# Patient Record
Sex: Female | Born: 1970 | Race: Black or African American | Hispanic: No | Smoking: Current every day smoker
Health system: Southern US, Community
[De-identification: ages and names within clinical notes are randomized; demographics above are authoritative.]

## PROBLEM LIST (undated history)

## (undated) DIAGNOSIS — D649 Anemia, unspecified: Secondary | ICD-10-CM

## (undated) DIAGNOSIS — R7303 Prediabetes: Secondary | ICD-10-CM

## (undated) HISTORY — DX: Prediabetes: R73.03

## (undated) HISTORY — DX: Anemia, unspecified: D64.9

## (undated) HISTORY — PX: TUBAL LIGATION: SHX77

---

## 1998-06-30 ENCOUNTER — Emergency Department (HOSPITAL_COMMUNITY): Admission: EM | Admit: 1998-06-30 | Discharge: 1998-06-30 | Payer: Self-pay | Admitting: Emergency Medicine

## 1999-06-03 ENCOUNTER — Inpatient Hospital Stay (HOSPITAL_COMMUNITY): Admission: AD | Admit: 1999-06-03 | Discharge: 1999-06-07 | Payer: Self-pay | Admitting: Obstetrics and Gynecology

## 1999-10-12 ENCOUNTER — Emergency Department (HOSPITAL_COMMUNITY): Admission: EM | Admit: 1999-10-12 | Discharge: 1999-10-12 | Payer: Self-pay | Admitting: Emergency Medicine

## 2000-01-13 ENCOUNTER — Other Ambulatory Visit: Admission: RE | Admit: 2000-01-13 | Discharge: 2000-01-13 | Payer: Self-pay | Admitting: Obstetrics and Gynecology

## 2000-11-11 ENCOUNTER — Ambulatory Visit (HOSPITAL_COMMUNITY): Admission: RE | Admit: 2000-11-11 | Discharge: 2000-11-11 | Payer: Self-pay | Admitting: Obstetrics and Gynecology

## 2002-03-13 ENCOUNTER — Encounter: Payer: Self-pay | Admitting: *Deleted

## 2002-03-13 ENCOUNTER — Emergency Department (HOSPITAL_COMMUNITY): Admission: EM | Admit: 2002-03-13 | Discharge: 2002-03-13 | Payer: Self-pay | Admitting: Emergency Medicine

## 2002-04-23 ENCOUNTER — Encounter: Admission: RE | Admit: 2002-04-23 | Discharge: 2002-04-23 | Payer: Self-pay | Admitting: Family Medicine

## 2002-04-23 ENCOUNTER — Encounter: Payer: Self-pay | Admitting: Family Medicine

## 2002-10-25 ENCOUNTER — Encounter: Admission: RE | Admit: 2002-10-25 | Discharge: 2002-11-05 | Payer: Self-pay | Admitting: *Deleted

## 2002-11-21 ENCOUNTER — Emergency Department (HOSPITAL_COMMUNITY): Admission: EM | Admit: 2002-11-21 | Discharge: 2002-11-21 | Payer: Self-pay | Admitting: Emergency Medicine

## 2002-11-21 ENCOUNTER — Encounter: Payer: Self-pay | Admitting: Emergency Medicine

## 2003-03-05 ENCOUNTER — Encounter: Payer: Self-pay | Admitting: Family Medicine

## 2003-03-05 ENCOUNTER — Encounter: Admission: RE | Admit: 2003-03-05 | Discharge: 2003-03-05 | Payer: Self-pay | Admitting: Family Medicine

## 2004-06-26 ENCOUNTER — Ambulatory Visit (HOSPITAL_COMMUNITY): Admission: RE | Admit: 2004-06-26 | Discharge: 2004-06-26 | Payer: Self-pay | Admitting: Family Medicine

## 2005-01-09 ENCOUNTER — Emergency Department: Payer: Self-pay | Admitting: Emergency Medicine

## 2007-08-09 ENCOUNTER — Emergency Department (HOSPITAL_COMMUNITY): Admission: EM | Admit: 2007-08-09 | Discharge: 2007-08-10 | Payer: Self-pay | Admitting: Emergency Medicine

## 2009-07-05 ENCOUNTER — Emergency Department (HOSPITAL_COMMUNITY): Admission: EM | Admit: 2009-07-05 | Discharge: 2009-07-06 | Payer: Self-pay | Admitting: Emergency Medicine

## 2009-10-01 ENCOUNTER — Other Ambulatory Visit: Admission: RE | Admit: 2009-10-01 | Discharge: 2009-10-01 | Payer: Self-pay | Admitting: Family Medicine

## 2011-05-14 NOTE — Op Note (Signed)
[facility]  Patient:    Tonya [name], Tonya [name]                        MRN: [id number] Proc. Date: 11/11/00 Attending:  Marcelle Overlie, M.D.                           Operative Report  PREOPERATIVE DIAGNOSIS:       Desires permanent sterilization.  POSTOPERATIVE DIAGNOSIS:      Desires permanent sterilization.  OPERATION:                    Laparoscopic bilateral tubal ligation.  SURGEON:                      Marcelle Overlie, M.D.  ANESTHESIA:                   General.  ESTIMATED BLOOD LOSS:         Minimal.  FINDINGS:                     Normal tubes and ovaries, small uterine fibroids.  DESCRIPTION OF PROCEDURE:     The patient was taken to the operating room. She was intubated without difficulty.  The abdomen, vagina, and vulva were prepped and draped in the usual sterile fashion, and in and out catheter was used to empty the bladder.  The speculum was inserted into the vagina, and a uterine manipulator was inserted into the uterus.  Attention was then turned to the abdomen where a small vertical 1 cm incision was made at the umbilicus.  The Veress needle was inserted with the first attempt, and pneumoperitoneum was confirmed.  CO2 was attached, and opening pressure was 0 mmHg.  A pneumoperitoneum was then performed.  The Veress needle was removed, and a 10 mm trocar was inserted through the same incision with the first attempt. Laparoscope was inserted into the abdomen, and it was noted that there were no areas of bleeding and no areas of injury noted.  The entire abdomen and pelvis were inspected.  The upper abdomen was normal.  There was one small omental adhesion to the left lower quadrant which was cut with scissors.  Attention to the pelvis revealed slightly enlarged uterus which was slightly irregular on its surface and consistent with fibroids.  The tubes were normal and ovaries were normal.  There was no evidence of endometriosis, and the  cul-de-sac was clear.  Using the Kleppinger forceps, initially the right fallopian tube was lifted up in its ampullary portion, and, using the triple burn technique using electrocautery, a tubal ligation was performed.  In likewise fashion, the left fallopian tube was seen throughout its entirety, and the ampullary portion was lifted up, and the triple burn electrocautery was then performed.  After the tubal ligations, the instruments were removed from the abdomen.  The pneumoperitoneum was released.  Dermabond was used to close the single incision at the umbilicus, and local was injected at the end of the procedure. All sponge, lap, and instrument counts were correct x 2.  The patient tolerated the procedure well and went to the recovery room in stable condition. DD:  11/11/00 TD:  11/11/00 Job: [id number]

## 2011-06-27 ENCOUNTER — Emergency Department (HOSPITAL_COMMUNITY)
Admission: EM | Admit: 2011-06-27 | Discharge: 2011-06-27 | Disposition: A | Discharge status: Home / Self Care | Payer: Self-pay | Attending: Emergency Medicine | Admitting: Emergency Medicine

## 2011-06-27 ENCOUNTER — Emergency Department (HOSPITAL_COMMUNITY): Payer: Self-pay

## 2011-06-27 DIAGNOSIS — M76899 Other specified enthesopathies of unspecified lower limb, excluding foot: Secondary | ICD-10-CM | POA: Insufficient documentation

## 2011-06-27 DIAGNOSIS — J45909 Unspecified asthma, uncomplicated: Secondary | ICD-10-CM | POA: Insufficient documentation

## 2011-06-27 DIAGNOSIS — M25559 Pain in unspecified hip: Secondary | ICD-10-CM | POA: Insufficient documentation

## 2011-06-27 DIAGNOSIS — E119 Type 2 diabetes mellitus without complications: Secondary | ICD-10-CM | POA: Insufficient documentation

## 2011-10-11 LAB — CBC
Hemoglobin: 11.5 — ABNORMAL LOW
RDW: 16.9 — ABNORMAL HIGH
WBC: 6.2

## 2011-10-11 LAB — URINALYSIS, ROUTINE W REFLEX MICROSCOPIC
Ketones, ur: 15 — AB
Nitrite: NEGATIVE
Specific Gravity, Urine: 1.021
pH: 6.5

## 2011-10-11 LAB — POCT PREGNANCY, URINE: Operator id: 208821

## 2011-10-11 LAB — COMPREHENSIVE METABOLIC PANEL
ALT: 1085 — ABNORMAL HIGH
Albumin: 3.1 — ABNORMAL LOW
Alkaline Phosphatase: 92
Chloride: 105
Glucose, Bld: 98
Potassium: 3.3 — ABNORMAL LOW
Sodium: 136
Total Protein: 7.8

## 2011-10-11 LAB — DIFFERENTIAL
Basophils Relative: 0
Eosinophils Absolute: 0.1
Monocytes Absolute: 0.8 — ABNORMAL HIGH
Neutro Abs: 3.3
Neutrophils Relative %: 53

## 2013-03-23 ENCOUNTER — Emergency Department (HOSPITAL_COMMUNITY)
Admission: EM | Admit: 2013-03-23 | Discharge: 2013-03-24 | Disposition: A | Discharge status: Home / Self Care | Payer: Self-pay | Attending: Emergency Medicine | Admitting: Emergency Medicine

## 2013-03-23 ENCOUNTER — Encounter (HOSPITAL_COMMUNITY): Payer: Self-pay | Admitting: Emergency Medicine

## 2013-03-23 DIAGNOSIS — F17200 Nicotine dependence, unspecified, uncomplicated: Secondary | ICD-10-CM | POA: Insufficient documentation

## 2013-03-23 DIAGNOSIS — R1032 Left lower quadrant pain: Secondary | ICD-10-CM | POA: Insufficient documentation

## 2013-03-23 DIAGNOSIS — J3489 Other specified disorders of nose and nasal sinuses: Secondary | ICD-10-CM | POA: Insufficient documentation

## 2013-03-23 DIAGNOSIS — R059 Cough, unspecified: Secondary | ICD-10-CM | POA: Insufficient documentation

## 2013-03-23 DIAGNOSIS — B9789 Other viral agents as the cause of diseases classified elsewhere: Secondary | ICD-10-CM | POA: Insufficient documentation

## 2013-03-23 DIAGNOSIS — R509 Fever, unspecified: Secondary | ICD-10-CM | POA: Insufficient documentation

## 2013-03-23 DIAGNOSIS — R05 Cough: Secondary | ICD-10-CM | POA: Insufficient documentation

## 2013-03-23 DIAGNOSIS — B349 Viral infection, unspecified: Secondary | ICD-10-CM

## 2013-03-23 DIAGNOSIS — J40 Bronchitis, not specified as acute or chronic: Secondary | ICD-10-CM | POA: Insufficient documentation

## 2013-03-23 DIAGNOSIS — Z9851 Tubal ligation status: Secondary | ICD-10-CM | POA: Insufficient documentation

## 2013-03-23 DIAGNOSIS — R093 Abnormal sputum: Secondary | ICD-10-CM | POA: Insufficient documentation

## 2013-03-23 MED ORDER — BENZONATATE 200 MG PO CAPS: 200.0000 mg | ORAL_CAPSULE | Freq: Three times a day (TID) | ORAL | Status: DC | PRN

## 2013-03-23 MED ORDER — BENZONATATE 100 MG PO CAPS
200.0000 mg | ORAL_CAPSULE | Freq: Once | ORAL | Status: AC
  Administered 2013-03-23: 200 mg via ORAL
  Filled 2013-03-23: qty 2

## 2013-03-23 MED ORDER — GUAIFENESIN ER 600 MG PO TB12
1200.0000 mg | ORAL_TABLET | Freq: Once | ORAL | Status: AC
  Administered 2013-03-23: 1200 mg via ORAL
  Filled 2013-03-23: qty 2

## 2013-03-23 MED ORDER — GUAIFENESIN ER 600 MG PO TB12: 1200.0000 mg | ORAL_TABLET | Freq: Two times a day (BID) | ORAL | Status: DC

## 2013-03-23 NOTE — ED Notes (Signed)
Pt c/o cough, sore throat, fever, LLQ pain x 3 days. Denies n/v/d, pt states abnormal diaphoresis, especially during sleep.

## 2013-03-23 NOTE — ED Provider Notes (Signed)
MDM  Tonya [name] is a 42 y.o. Tonya presenting with viral upper respiratory infection symptoms, patient has mildly productive cough, intermittent loss of voice, some clear nasal discharge and occasional congestion. Patient's been taking over-the-counter medication without relief. Patient is having aching type chest pain is worse on coughing in the upper chest, this is likely related to the viral process and does not represent acute coronary syndrome, or a pulmonary embolism, patient is low risk by Wells criteria, she does get a point for pulse rate of 114, I do not think this is significant with respect to her presentation today.  Abdomen is mildly sore in the left lower quadrant, this could be secondary to cough, do not think this represents an intra-abdominal emergency. Patient has a history of diverticulosis/diverticulitis, she is afebrile, no blood in the stools, no nausea or vomiting. I do not think any further imaging or testing is indicated at this time.  Discussed not using antibiotics with the patient, she understands accepts medical plan treat conservatively, she's given return precautions, she understands these instructions and her questions have been answered   No results found.   1. Viral syndrome   2. Bronchitis   3. Left lower quadrant pain     Allergies  Review of patient's allergies indicates no known allergies. History   CSN: [id number]  Arrival date & time: 3/28/142232; First MD Initiated Contact with Patient    Chief Complaint  Patient presents with  . Sore Throat  . Cough   Sore Throat and Cough   HPI: Tonya [name] is a 42 y.o. Tonya degrees history of cough, congestion, fevers and chills. Patient is a daily smoker, this is a pack cigarettes last are 2-3 days. Symptoms started about 3 days ago with runny nose, sore throat, progressed to a cough which is been intermittently productive of green sputum. She's not had any nausea vomiting or diarrhea. She's  had some left lower quadrant pain it is been fairly constant, aching, minimal to moderate, no diarrhea, no blood in the stools, no history of constipation, last bowel movement was this morning. No vaginal discharge or bleeding. No sinus pressure. Patient has history of asthma as a child but says she grew out of it.  ROS: At least 10pt or greater review of systems completed and are negative except where specified in the HPI.  VITAL SIGNS:   Filed Vitals:   03/23/13 2237  BP: 106/67  Pulse: 114  Temp: 99.7 F (37.6 C)  TempSrc: Oral  Resp: 18  Weight: 158 lb 2 oz (71.725 kg)  SpO2: 100%   CONSTITUTIONAL: Awake, orientedx4, appears non-toxic, well nourished and well developed HENT: Atraumatic, normocephalic, oral mucosa pink and moist w/o erythema or exudate, airway patent. Nares patent with scant clear drainage, turbinates appear mildly erythematous. External ears normal, TMs clear bilaterally. EYES: Conjunctiva clear, EOMI, PERRLA NECK: Trachea midline, non-tender, supple CARDIOVASCULAR: Normal heart rate, Normal rhythm, No murmurs, rubs, gallops PULMONARY/CHEST: Clear to auscultation, no rhonchi, wheezes, or rales. Symmetrical breath sounds. Non-tender. ABDOMINAL: Non-distended, soft,  Mildly tender to palpation in the left lower quadrant without rebound or guarding.  BS normal. NEUROLOGIC: Non-focal, moving all four extremities, no gross sensory or motor deficits. EXTREMITIES: No clubbing, cyanosis, or edema SKIN: Warm, Dry, No erythema, No rash   Home Medications   Current Outpatient Rx  Name  Route  Sig  Dispense  Refill  . DM-Doxylamine-Acetaminophen (NYQUIL COLD & FLU PO)   Oral   Take 1 capsule by  mouth 2 (two) times daily as needed (FLU-LIKE SYMPTOMS).         Marland Kitchen ibuprofen (ADVIL,MOTRIN) 200 MG tablet   Oral   Take 400 mg by mouth every 6 (six) hours as needed for pain.         . Pseudoephedrine-APAP-DM (DAYQUIL MULTI-SYMPTOM COLD/FLU PO)   Oral   Take 1 capsule by  mouth 2 (two) times daily as needed (FLU-LIKE SYMPTOMS).           History reviewed. No pertinent past medical history.  Past Surgical History  Procedure Laterality Date  . Tubal ligation    . Cesarean section      No family history on file.  History  Substance Use Topics  . Smoking status: Current Every Day Smoker  . Smokeless tobacco: Not on file  . Alcohol Use: Yes     Comment: occasional         Jones Skene, MD 03/23/13 2355

## 2014-11-26 ENCOUNTER — Emergency Department (HOSPITAL_COMMUNITY)
Admission: EM | Admit: 2014-11-26 | Discharge: 2014-11-26 | Disposition: A | Discharge status: Home / Self Care | Payer: Self-pay | Attending: Emergency Medicine | Admitting: Emergency Medicine

## 2014-11-26 ENCOUNTER — Encounter (HOSPITAL_COMMUNITY): Payer: Self-pay | Admitting: *Deleted

## 2014-11-26 DIAGNOSIS — Z720 Tobacco use: Secondary | ICD-10-CM | POA: Insufficient documentation

## 2014-11-26 DIAGNOSIS — R102 Pelvic and perineal pain: Secondary | ICD-10-CM | POA: Insufficient documentation

## 2014-11-26 DIAGNOSIS — Z9851 Tubal ligation status: Secondary | ICD-10-CM | POA: Insufficient documentation

## 2014-11-26 DIAGNOSIS — Z79899 Other long term (current) drug therapy: Secondary | ICD-10-CM | POA: Insufficient documentation

## 2014-11-26 DIAGNOSIS — N898 Other specified noninflammatory disorders of vagina: Secondary | ICD-10-CM | POA: Insufficient documentation

## 2014-11-26 LAB — URINALYSIS, ROUTINE W REFLEX MICROSCOPIC
Bilirubin Urine: NEGATIVE
GLUCOSE, UA: NEGATIVE mg/dL
KETONES UR: NEGATIVE mg/dL
LEUKOCYTES UA: NEGATIVE
Nitrite: NEGATIVE
PH: 6 (ref 5.0–8.0)
PROTEIN: NEGATIVE mg/dL
Specific Gravity, Urine: 1.009 (ref 1.005–1.030)
Urobilinogen, UA: 0.2 mg/dL (ref 0.0–1.0)

## 2014-11-26 LAB — CBC WITH DIFFERENTIAL/PLATELET
BASOS PCT: 0 % (ref 0–1)
Basophils Absolute: 0 10*3/uL (ref 0.0–0.1)
EOS PCT: 1 % (ref 0–5)
Eosinophils Absolute: 0.1 10*3/uL (ref 0.0–0.7)
HCT: 34.9 % — ABNORMAL LOW (ref 36.0–46.0)
HEMOGLOBIN: 10.7 g/dL — AB (ref 12.0–15.0)
LYMPHS PCT: 23 % (ref 12–46)
Lymphs Abs: 2.6 10*3/uL (ref 0.7–4.0)
MCH: 21.5 pg — ABNORMAL LOW (ref 26.0–34.0)
MCHC: 30.7 g/dL (ref 30.0–36.0)
MCV: 70.2 fL — ABNORMAL LOW (ref 78.0–100.0)
MONO ABS: 0.6 10*3/uL (ref 0.1–1.0)
MONOS PCT: 5 % (ref 3–12)
NEUTROS PCT: 71 % (ref 43–77)
Neutro Abs: 7.9 10*3/uL — ABNORMAL HIGH (ref 1.7–7.7)
PLATELETS: 432 10*3/uL — AB (ref 150–400)
RBC: 4.97 MIL/uL (ref 3.87–5.11)
RDW: 15.4 % (ref 11.5–15.5)
WBC: 11.2 10*3/uL — AB (ref 4.0–10.5)

## 2014-11-26 LAB — COMPREHENSIVE METABOLIC PANEL
ALK PHOS: 61 U/L (ref 39–117)
ALT: 13 U/L (ref 0–35)
AST: 17 U/L (ref 0–37)
Albumin: 3.8 g/dL (ref 3.5–5.2)
Anion gap: 11 (ref 5–15)
BUN: 8 mg/dL (ref 6–23)
CALCIUM: 9.9 mg/dL (ref 8.4–10.5)
CO2: 26 meq/L (ref 19–32)
Chloride: 101 mEq/L (ref 96–112)
Creatinine, Ser: 0.7 mg/dL (ref 0.50–1.10)
GLUCOSE: 86 mg/dL (ref 70–99)
POTASSIUM: 4.1 meq/L (ref 3.7–5.3)
SODIUM: 138 meq/L (ref 137–147)
Total Bilirubin: 0.2 mg/dL — ABNORMAL LOW (ref 0.3–1.2)
Total Protein: 8.3 g/dL (ref 6.0–8.3)

## 2014-11-26 LAB — WET PREP, GENITAL
Trich, Wet Prep: NONE SEEN
YEAST WET PREP: NONE SEEN

## 2014-11-26 LAB — URINE MICROSCOPIC-ADD ON

## 2014-11-26 LAB — LIPASE, BLOOD: Lipase: 18 U/L (ref 11–59)

## 2014-11-26 MED ORDER — HYDROCODONE-ACETAMINOPHEN 5-325 MG PO TABS: 1.0000 | ORAL_TABLET | Freq: Four times a day (QID) | ORAL | Status: DC | PRN

## 2014-11-26 MED ORDER — AZITHROMYCIN 250 MG PO TABS
1000.0000 mg | ORAL_TABLET | Freq: Once | ORAL | Status: AC
  Administered 2014-11-26: 1000 mg via ORAL
  Filled 2014-11-26: qty 4

## 2014-11-26 MED ORDER — CEFTRIAXONE SODIUM 250 MG IJ SOLR
250.0000 mg | Freq: Once | INTRAMUSCULAR | Status: AC
  Administered 2014-11-26: 250 mg via INTRAMUSCULAR
  Filled 2014-11-26: qty 250

## 2014-11-26 MED ORDER — LIDOCAINE HCL 1 % IJ SOLN
INTRAMUSCULAR | Status: AC
  Administered 2014-11-26: 1 mL
  Filled 2014-11-26: qty 20

## 2014-11-26 NOTE — ED Notes (Signed)
Pt denies burning or freq w/ urination, states did notice today urine was darker w/ a hazy color.

## 2014-11-26 NOTE — ED Provider Notes (Signed)
CSN: [id number]     Arrival date & time 11/26/14  1804 History   First MD Initiated Contact with Patient 11/26/14 2118     Chief Complaint  Patient presents with  . Abdominal Pain     (Consider location/radiation/quality/duration/timing/severity/associated sxs/prior Treatment) Patient is a 43 y.o. female presenting with abdominal pain. The history is provided by the patient.  Abdominal Pain Pain location:  Suprapubic Pain quality: aching and gnawing   Pain radiates to:  Does not radiate Pain severity:  Moderate Onset quality:  Gradual Duration:  2 days Timing:  Constant Progression:  Worsening Chronicity:  New Context: not awakening from sleep, not medication withdrawal, not previous surgeries, not suspicious food intake and not trauma   Relieved by:  Nothing Worsened by:  Nothing tried Ineffective treatments:  None tried Associated symptoms: dysuria and vaginal discharge   Associated symptoms: no anorexia, no constipation, no diarrhea, no fever, no nausea, no shortness of breath, no vaginal bleeding and no vomiting   Risk factors: not pregnant and no recent hospitalization     History reviewed. No pertinent past medical history. Past Surgical History  Procedure Laterality Date  . Tubal ligation    . Cesarean section     No family history on file. History  Substance Use Topics  . Smoking status: Current Every Day Smoker  . Smokeless tobacco: Never Used  . Alcohol Use: Yes     Comment: occasional   OB History    No data available     Review of Systems  Constitutional: Negative for fever.  Respiratory: Negative for shortness of breath.   Gastrointestinal: Positive for abdominal pain. Negative for nausea, vomiting, diarrhea, constipation and anorexia.  Genitourinary: Positive for dysuria and vaginal discharge. Negative for vaginal bleeding.  All other systems reviewed and are negative.     Allergies  Review of patient's allergies indicates no known  allergies.  Home Medications   Prior to Admission medications   Medication Sig Start Date End Date Taking? Authorizing Provider  ibuprofen (ADVIL,MOTRIN) 200 MG tablet Take 400 mg by mouth every 6 (six) hours as needed for pain.   Yes Historical Provider, MD  metFORMIN (GLUCOPHAGE) 500 MG tablet Take 500 mg by mouth daily.   Yes Historical Provider, MD  benzonatate (TESSALON) 200 MG capsule Take 1 capsule (200 mg total) by mouth 3 (three) times daily as needed for cough. Patient not taking: Reported on 11/26/2014 03/23/13   Jones SkeneJohn-Adam Bonk, MD  guaiFENesin (MUCINEX) 600 MG 12 hr tablet Take 2 tablets (1,200 mg total) by mouth 2 (two) times daily. Patient not taking: Reported on 11/26/2014 03/23/13   John-Adam Bonk, MD   BP 119/78 mmHg  Pulse 102  Temp(Src) 97.9 F (36.6 C) (Oral)  Resp 18  Ht 5\' 1"  (1.549 m)  Wt 160 lb (72.576 kg)  BMI 30.25 kg/m2  SpO2 100%  LMP 11/21/2014 Physical Exam  Constitutional: She is oriented to person, place, and time. She appears well-developed and well-nourished. No distress.  HENT:  Head: Normocephalic and atraumatic.  Mouth/Throat: Oropharynx is clear and moist.  Eyes: Conjunctivae and EOM are normal. Pupils are equal, round, and reactive to light.  Neck: Normal range of motion. Neck supple.  Cardiovascular: Normal rate, regular rhythm and intact distal pulses.   No murmur heard. Pulmonary/Chest: Effort normal and breath sounds normal. No respiratory distress. She has no wheezes. She has no rales.  Abdominal: Soft. Normal appearance. She exhibits no distension. There is tenderness in the suprapubic area. There  is no rebound, no guarding and no CVA tenderness.  Genitourinary: Uterus normal. Cervix exhibits discharge. Cervix exhibits no friability. Right adnexum displays no mass, no tenderness and no fullness. Left adnexum displays no mass, no tenderness and no fullness. No tenderness in the vagina. Vaginal discharge found.  Copious thin white vaginal  discharge.  Mild diffuse discomfort on bimanual exam  Musculoskeletal: Normal range of motion. She exhibits no edema or tenderness.  Neurological: She is alert and oriented to person, place, and time.  Skin: Skin is warm and dry. No rash noted. No erythema.  Psychiatric: She has a normal mood and affect. Her behavior is normal.  Nursing note and vitals reviewed.   ED Course  Procedures (including critical care time) Labs Review Labs Reviewed  WET PREP, GENITAL - Abnormal; Notable for the following:    Clue Cells Wet Prep HPF POC FEW (*)    WBC, Wet Prep HPF POC FEW (*)    All other components within normal limits  CBC WITH DIFFERENTIAL - Abnormal; Notable for the following:    WBC 11.2 (*)    Hemoglobin 10.7 (*)    HCT 34.9 (*)    MCV 70.2 (*)    MCH 21.5 (*)    Platelets 432 (*)    Neutro Abs 7.9 (*)    All other components within normal limits  COMPREHENSIVE METABOLIC PANEL - Abnormal; Notable for the following:    Total Bilirubin 0.2 (*)    All other components within normal limits  URINALYSIS, ROUTINE W REFLEX MICROSCOPIC - Abnormal; Notable for the following:    Hgb urine dipstick SMALL (*)    All other components within normal limits  URINE MICROSCOPIC-ADD ON - Abnormal; Notable for the following:    Squamous Epithelial / LPF FEW (*)    All other components within normal limits  GC/CHLAMYDIA PROBE AMP  LIPASE, BLOOD    Imaging Review No results found.   EKG Interpretation None      MDM   Final diagnoses:  Pelvic pain in female    Patient with lower abdominal tenderness, increased vaginal discharge and intermittent dysuria that started yesterday and has worsened. Patient denies any systemic symptoms such as fever, nausea, vomiting or diarrhea. She is sexually active with only 1 partner and does not use protection.  Patient has suprapubic tenderness and no flank tenderness and no symptoms concerning for diverticulitis, appendicitis, cholecystitis or  pancreatitis.  Concern for possible STD versus UTI. Pelvic exam pending  10:53 PM Wet prep without signs of trich and only few clue cells.  Will treat with rocephin and azithro with cultures pending.  Gwyneth SproutWhitney Jada Kuhnert, MD 11/26/14 [phone]

## 2014-11-26 NOTE — ED Notes (Signed)
Pt states started having lower abdominal/pelvic pain on Sunday, states has been on/off since, then today has been more severe and noticeable, states has been having normal BMs, denies n/v, normal periods.

## 2014-11-27 LAB — GC/CHLAMYDIA PROBE AMP
CT PROBE, AMP APTIMA: NEGATIVE
GC Probe RNA: NEGATIVE

## 2014-12-16 ENCOUNTER — Encounter (HOSPITAL_COMMUNITY): Payer: Self-pay | Admitting: *Deleted

## 2014-12-16 ENCOUNTER — Emergency Department (HOSPITAL_COMMUNITY)
Admission: EM | Admit: 2014-12-16 | Discharge: 2014-12-16 | Disposition: A | Discharge status: Home / Self Care | Payer: Self-pay | Attending: Emergency Medicine | Admitting: Emergency Medicine

## 2014-12-16 ENCOUNTER — Emergency Department (HOSPITAL_COMMUNITY): Payer: Self-pay

## 2014-12-16 DIAGNOSIS — Z720 Tobacco use: Secondary | ICD-10-CM | POA: Insufficient documentation

## 2014-12-16 DIAGNOSIS — Z3202 Encounter for pregnancy test, result negative: Secondary | ICD-10-CM | POA: Insufficient documentation

## 2014-12-16 DIAGNOSIS — Z79899 Other long term (current) drug therapy: Secondary | ICD-10-CM | POA: Insufficient documentation

## 2014-12-16 DIAGNOSIS — M25551 Pain in right hip: Secondary | ICD-10-CM | POA: Insufficient documentation

## 2014-12-16 LAB — POC URINE PREG, ED: Preg Test, Ur: NEGATIVE

## 2014-12-16 MED ORDER — IBUPROFEN 600 MG PO TABS: 600.0000 mg | ORAL_TABLET | Freq: Four times a day (QID) | ORAL | Status: DC | PRN

## 2014-12-16 MED ORDER — HYDROCODONE-ACETAMINOPHEN 5-325 MG PO TABS: 1.0000 | ORAL_TABLET | Freq: Four times a day (QID) | ORAL | Status: DC | PRN

## 2014-12-16 MED ORDER — HYDROCODONE-ACETAMINOPHEN 5-325 MG PO TABS
2.0000 | ORAL_TABLET | Freq: Once | ORAL | Status: AC
  Administered 2014-12-16: 2 via ORAL
  Filled 2014-12-16: qty 2

## 2014-12-16 NOTE — ED Provider Notes (Signed)
CSN: [id number]     Arrival date & time 12/16/14  1017 History   First MD Initiated Contact with Patient 12/16/14 1051     Chief Complaint  Patient presents with  . Hip Pain     (Consider location/radiation/quality/duration/timing/severity/associated sxs/prior Treatment) HPI Tonya [name] is a 43 year old female who presents the ER complaining of right-sided hip pain for the past 3 days. Patient states her pain began gradually on Friday, and as she was walking throughout the day on Saturday her hip pain worsened. Patient states her pain continued to worsen throughout yesterday, and today she states the pain is a 10 out of 10, and is extremely painful to walk on or ambulate with. Patient denies any radiation of her pain. Patient states she experienced a similar episode 3 years ago, when she states she is diagnosed with bursitis of her hip. Patient denies fever, chills, swelling, redness of her hip. Patient denies numbness, weakness.    History reviewed. No pertinent past medical history. Past Surgical History  Procedure Laterality Date  . Tubal ligation    . Cesarean section     History reviewed. No pertinent family history. History  Substance Use Topics  . Smoking status: Current Every Day Smoker  . Smokeless tobacco: Never Used  . Alcohol Use: Yes     Comment: occasional   OB History    No data available     Review of Systems  Constitutional: Negative for fever.  HENT: Negative for trouble swallowing.   Eyes: Negative for visual disturbance.  Respiratory: Negative for shortness of breath.   Cardiovascular: Negative for chest pain.  Gastrointestinal: Negative for nausea, vomiting and abdominal pain.  Genitourinary: Negative for dysuria.  Musculoskeletal: Positive for arthralgias. Negative for neck pain.  Skin: Negative for rash.  Neurological: Negative for dizziness, weakness and numbness.  Psychiatric/Behavioral: Negative.       Allergies  Review of patient's allergies  indicates no known allergies.  Home Medications   Prior to Admission medications   Medication Sig Start Date End Date Taking? Authorizing Provider  benzonatate (TESSALON) 200 MG capsule Take 1 capsule (200 mg total) by mouth 3 (three) times daily as needed for cough. Patient not taking: Reported on 11/26/2014 03/23/13   Jones SkeneJohn-Adam Bonk, MD  guaiFENesin (MUCINEX) 600 MG 12 hr tablet Take 2 tablets (1,200 mg total) by mouth 2 (two) times daily. Patient not taking: Reported on 11/26/2014 03/23/13   Jones SkeneJohn-Adam Bonk, MD  HYDROcodone-acetaminophen (NORCO/VICODIN) 5-325 MG per tablet Take 1-2 tablets by mouth every 6 (six) hours as needed for moderate pain or severe pain. 12/16/14   Monte FantasiaJoseph W Laloni Rowton, PA-C  ibuprofen (ADVIL,MOTRIN) 600 MG tablet Take 1 tablet (600 mg total) by mouth every 6 (six) hours as needed. 12/16/14   Monte FantasiaJoseph W Jariyah Hackley, PA-C  metFORMIN (GLUCOPHAGE) 500 MG tablet Take 500 mg by mouth daily.    Historical Provider, MD   BP 121/70 mmHg  Pulse 70  Temp(Src) 98.3 F (36.8 C) (Oral)  Resp 18  SpO2 100%  LMP 12/12/2014 (Exact Date) Physical Exam  Constitutional: She appears well-developed and well-nourished. No distress.  HENT:  Head: Normocephalic and atraumatic.  Eyes: Conjunctivae are normal. Right eye exhibits no discharge. Left eye exhibits no discharge. No scleral icterus.  Cardiovascular:  Peripheral pulses intact at injured extremity.   Pulmonary/Chest: Effort normal. No respiratory distress.  Musculoskeletal:  Pain with range of motion of right hip. Patient locates her pain on her lateral trochanteric region. Moderate amount of tenderness to palpation  of region. No erythema, edema noted on exam.  Neurological: She is alert.  No numbness distal to injury.    Skin: Skin is warm and dry. No rash noted. She is not diaphoretic.  Nursing note and vitals reviewed.   ED Course  Procedures (including critical care time) Labs Review Labs Reviewed  POC URINE PREG, ED    Imaging  Review Dg Hip Complete Right  12/16/2014   CLINICAL DATA:  Right hip pain for 3 days  EXAM: RIGHT HIP - COMPLETE 2+ VIEW  COMPARISON:  None.  FINDINGS: There is no evidence of hip fracture or dislocation. 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 There is no lytic or sclerotic osseous lesion.  IMPRESSION: 1. No acute osseous injury of the right hip. 2. 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   Electronically Signed   By: Elige KoHetal  Patel   On: 12/16/2014 13:01     EKG Interpretation None      MDM   Final diagnoses:  Right hip pain    Patient here with 3 days of hip pain worsening over the weekend after overuse with walking.   Hip radiographs remarkable for impression: 1. No acute osseous injury of the right hip. 2. 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  No concern for hip fracture given these results and patient's history without any trauma. Patient is a history of bursitis, although patient is afebrile, without any obvious swelling or redness over trochanteric region. 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 I strongly recommended patient follow-up with orthopedics for her hip pain. I discussed RICE therapy and pain medications with patient. I discussed return precautions with patient also and she was agreeable to this plan. I encouraged patient to call or return to the ER should she have any questions or concerns.  BP 121/70 mmHg  Pulse 70  Temp(Src) 98.3 F (36.8 C) (Oral)  Resp 18  SpO2 100%  LMP 12/12/2014 (Exact Date)  Signed,  Ladona MowJoe Ryka Beighley, PA-C 9:27 PM     Monte FantasiaJoseph W Deroy Noah, PA-C 12/16/14 2127  Glynn OctaveStephen Rancour, MD 12/17/14 [phone]

## 2014-12-16 NOTE — Discharge Instructions (Signed)
Follow up with orthopedics.  You may alternate ibuprofen and hydrocodone every 4 hour as needed for pain. Return to the ER with any severe pain, swelling, redness in your hip, return with any high fever, nausea or vomiting.  Rest your hip and use heating pad on affected area.   Calcific Tendinitis Calcific tendinitis occurs when crystals of calcium are deposited in a tendon. Tendons are bands of strong, fibrous tissue that attach muscles to bones. Tendons are an important part of joints. They make the joint move and they absorb some of the stress that a joint receives during use. When calcium is deposited in the tendon, the tendon becomes stiff, painful, and it can become swollen. Calcific tendinitis occurs frequently in the shoulder joint, in a structure called the rotator cuff. CAUSES  The cause of calcific tendinitis is unclear. It may be associated with:  Overuse of the tendon, such as from repetitive motion.  Excess stress on the tendon.  Aging.  Repetitive, mild injuries. SYMPTOMS   Pain may or may not be present. If it is present, it may occur when moving the joint.  Tenderness when pressure is applied to the tendon.  A snapping or popping sound when the joint moves.  Decreased motion of the joint.  Difficulty sleeping due to pain in the joint. DIAGNOSIS  Your health care provider will perform a physical exam. Imaging tests may also be used to make the diagnosis. These may include X-rays, an MRI, or a CT scan. TREATMENT  Generally, calcific tendinitis resolves on its own. Treatment for pain of calcific tendinitis may include:  Taking over-the-counter medicines, such as anti-inflammatory drugs.  Applying ice packs to the joint.  Following a specific exercise program to keep the joint working properly.  Attending physical therapy sessions.  Avoiding activities that cause pain. Treatment for more severe calcific tendinitis may require:  Injecting cortisone steroids or  pain relieving medicines into or around the joint.  Manipulating the joint after you are given medicine to numb the area (local anesthetic).  Inflating the joint with sterile fluid to increase the flexibility of the tendons.  Shockwave therapy, which involves focusing sound waves on the joint. If other treatments do not work, surgery may be done to clean out the calcium deposits and repair the tendons where needed. Most people do not need surgery. HOME CARE INSTRUCTIONS   Only take over-the-counter or prescription medicines for pain, fever, or discomfort as directed by your health care provider.  Follow your health care provider's recommendations for activity and exercise. SEEK MEDICAL CARE IF:  You notice an increase in pain or numbness.  You develop new weakness.  You notice increased joint stiffness or a sensation of looseness in the joint.  You notice increasing redness, swelling, or warmth around the joint area. SEEK IMMEDIATE MEDICAL CARE IF:  You have a fever or persistent symptoms for more than 2 to 3 days.  You have a fever and your symptoms suddenly get worse. MAKE SURE YOU:  Understand these instructions.  Will watch your condition.  Will get help right away if you are not doing well or get worse. Document Released: 09/21/2008 Document Revised: 04/29/2014 Document Reviewed: 03/23/2012 [facility] Patient Information 2015 [employer], [place]. This information is not intended to replace advice given to you by your health care provider. Make sure you discuss any questions you have with your health care provider.

## 2014-12-16 NOTE — ED Notes (Signed)
Pt stated she had just used the restroom and wasn't able to provide another sample right now. Will notify when she is able to void.

## 2014-12-16 NOTE — ED Notes (Signed)
Pt denies trauma or injury. Reports right hip pain started on Friday. Reports she walked a lot on Saturday. Today pt unable to walk, and had to crawl. Pain 10/10. Reports back pain episode 3 years ago

## 2015-01-21 ENCOUNTER — Encounter: Payer: Self-pay | Admitting: Family Medicine

## 2015-09-05 ENCOUNTER — Other Ambulatory Visit (HOSPITAL_COMMUNITY)
Admission: RE | Admit: 2015-09-05 | Discharge: 2015-09-05 | Disposition: A | Discharge status: Home / Self Care | Payer: Self-pay | Source: Ambulatory Visit | Attending: Obstetrics and Gynecology | Admitting: Obstetrics and Gynecology

## 2015-09-05 ENCOUNTER — Ambulatory Visit (INDEPENDENT_AMBULATORY_CARE_PROVIDER_SITE_OTHER): Payer: Self-pay | Admitting: Obstetrics and Gynecology

## 2015-09-05 ENCOUNTER — Encounter: Payer: Self-pay | Admitting: Obstetrics and Gynecology

## 2015-09-05 VITALS — BP 117/74 | HR 90 | Temp 98.7°F | Ht 61.0 in | Wt 164.9 lb

## 2015-09-05 DIAGNOSIS — Z113 Encounter for screening for infections with a predominantly sexual mode of transmission: Secondary | ICD-10-CM

## 2015-09-05 DIAGNOSIS — N920 Excessive and frequent menstruation with regular cycle: Secondary | ICD-10-CM

## 2015-09-05 DIAGNOSIS — Z01419 Encounter for gynecological examination (general) (routine) without abnormal findings: Secondary | ICD-10-CM

## 2015-09-05 DIAGNOSIS — Z118 Encounter for screening for other infectious and parasitic diseases: Secondary | ICD-10-CM

## 2015-09-05 DIAGNOSIS — Z124 Encounter for screening for malignant neoplasm of cervix: Secondary | ICD-10-CM

## 2015-09-05 DIAGNOSIS — Z1151 Encounter for screening for human papillomavirus (HPV): Secondary | ICD-10-CM

## 2015-09-05 LAB — CBC
HCT: 32.9 % — ABNORMAL LOW (ref 36.0–46.0)
HEMOGLOBIN: 10.4 g/dL — AB (ref 12.0–15.0)
MCH: 22 pg — AB (ref 26.0–34.0)
MCHC: 31.6 g/dL (ref 30.0–36.0)
MCV: 69.6 fL — ABNORMAL LOW (ref 78.0–100.0)
MPV: 9.1 fL (ref 8.6–12.4)
PLATELETS: 408 10*3/uL — AB (ref 150–400)
RBC: 4.73 MIL/uL (ref 3.87–5.11)
RDW: 15.7 % — ABNORMAL HIGH (ref 11.5–15.5)
WBC: 12 10*3/uL — AB (ref 4.0–10.5)

## 2015-09-05 LAB — TSH: TSH: 0.997 u[IU]/mL (ref 0.350–4.500)

## 2015-09-05 LAB — HEMOGLOBIN A1C
Hgb A1c MFr Bld: 5.7 % — ABNORMAL HIGH (ref <5.7)
MEAN PLASMA GLUCOSE: 117 mg/dL — AB (ref <117)

## 2015-09-05 NOTE — Progress Notes (Signed)
CLINIC ENCOUNTER NOTE  History:  44 y.o. [id number] here today for AUB. Referred by Dr. Parke Simmers, patient unaware of clinic name.  On metformin for prediatebes, most recent A1c was 5.6%. Also anemia, patient says most recent H in the 10s. Takes iron. On OCPs for 4 months. Smokes, no history migraines, no history venous thrombus, no history cancer. Last pap 2 years ago, wnl. No aspirin.  Hx one cesarean section.   Menarche age 82. No initial menorrhagia. History regular periods, approximately every 30 days, lasting 4-5 days, no intermenstrual bleeding, with some heavy bleeding for 2 days. Over past year longer and heavier periods, lasting 6 days, going through a pad or tampon every 2 hours for 2 of those days. No intermenstrual bleeding. Cramps are heavier than normal. No fevers or chills or nausea or vomiting. Hasn't had any w/u thus far.    Past Medical History  Diagnosis Date  . Prediabetes   . Anemia     Past Surgical History  Procedure Laterality Date  . Tubal ligation    . Cesarean section      The following portions of the patient's history were reviewed and updated as appropriate: allergies, current medications, past family history, past medical history, past social history, past surgical history and problem list.    Review of Systems:  See above; comprehensive review of systems was otherwise negative.  Objective:  Physical Exam BP 117/74 mmHg  Pulse 90  Temp(Src) 98.7 F (37.1 C) (Oral)  Ht  (1.549 m)  Wt 164 lb 14.4 oz (74.798 kg)  BMI 31.17 kg/m2  LMP 08/18/2015 CONSTITUTIONAL: Well-developed, well-nourished female in no acute distress.  HENT:  Normocephalic, atraumatic SKIN: Skin is warm and dry.  NEUROLGIC: Alert  PSYCHIATRIC: Normal mood and affect.  CARDIOVASCULAR: Normal heart rate noted RESPIRATORY: Effort and breath sounds normal, no problems with respiration noted ABDOMEN: Soft, no distention noted.  No tenderness, rebound or guarding.  PELVIC: normal  vagina and cervex, anteverted, no cmt, no adnexal tenderness or fullness  Procedure EMB Written informed consent obtained Patient deferred UPT as is s/p BTL and has been taking OCPs for 4 months, patient aware procedure could cause an abortion if pregnant and desires to proceed Betadine Tenaculum  Sound to 9.5 cm EMB x2 Bleeding minimal Patient tolerated procedure well, no complications  Labs and Imaging No results found.  Assessment & Plan:   # Abnormal uterine bleeding - likely ovulatory - normal exam today - Pap - Gc/chlamydia - CBC - TSH - Prolactin - Androgens - stop OCPs given thrombosis risk, and says bleeding is unchanged - if all negative, TVUS - f/u after the above  Routine preventative health maintenance measures emphasized.     Reza Crymes B. Jayceon Troy, MD OB/GYN Fellow Center for [employer], [facility]

## 2015-09-05 NOTE — Patient Instructions (Signed)
Today we performed an endometrial biospy. If you develop bleeding more than spotting, pain that does not go away or becomes severe, foul-smelling discharge, fever, or any other concern, seek medical attention.

## 2015-09-06 LAB — PROLACTIN: Prolactin: 10.1 ng/mL

## 2015-09-08 LAB — TESTOSTERONE, FREE, TOTAL, SHBG
Sex Hormone Binding: 184 nmol/L — ABNORMAL HIGH (ref 17–124)
TESTOSTERONE-% FREE: 0.5 % (ref 0.4–2.4)
TESTOSTERONE: 37 ng/dL (ref 10–70)
Testosterone, Free: 1.8 pg/mL (ref 0.6–6.8)

## 2015-09-08 LAB — CYTOLOGY - PAP

## 2015-09-08 LAB — GC/CHLAMYDIA PROBE AMP (~~LOC~~) NOT AT ARMC
CHLAMYDIA, DNA PROBE: NEGATIVE
Neisseria Gonorrhea: NEGATIVE

## 2015-09-09 ENCOUNTER — Telehealth: Payer: Self-pay | Admitting: General Practice

## 2015-09-09 ENCOUNTER — Telehealth: Payer: Self-pay | Admitting: Obstetrics and Gynecology

## 2015-09-09 DIAGNOSIS — N939 Abnormal uterine and vaginal bleeding, unspecified: Secondary | ICD-10-CM

## 2015-09-09 NOTE — Telephone Encounter (Signed)
AUB: labs wnl, EMB wnl. Will check TVUS, start provera, and f/u in gyn clinic after u/s.

## 2015-09-09 NOTE — Telephone Encounter (Addendum)
Per Dr Ashok Pall: patient's pap and blood work were normal. Patient needs ultrasound to evaluate bleeding and follow up appt in our office. Also, patient should begin provera which is at her pharmacy. Called patient, no answer- left message stating we are trying to reach you with non urgent results and in regards to an appt, please call us back at the clinics  9/14  1004  Pt left message yesterday @ 1638 stating that she is returning our call. She also stated that a message can be left on her voice mail as she is in meetings all this week and will probably not be able to take the return call.

## 2015-09-11 NOTE — Telephone Encounter (Signed)
Pt returned call. Ultrasound scheduled for Thursday 09/22 at 1000.

## 2015-09-18 ENCOUNTER — Ambulatory Visit (HOSPITAL_COMMUNITY): Payer: Self-pay

## 2015-09-22 ENCOUNTER — Ambulatory Visit (HOSPITAL_COMMUNITY)
Admission: RE | Admit: 2015-09-22 | Discharge: 2015-09-22 | Disposition: A | Discharge status: Home / Self Care | Payer: Self-pay | Source: Ambulatory Visit | Attending: Obstetrics and Gynecology | Admitting: Obstetrics and Gynecology

## 2015-09-22 DIAGNOSIS — N946 Dysmenorrhea, unspecified: Secondary | ICD-10-CM | POA: Insufficient documentation

## 2015-09-22 DIAGNOSIS — N920 Excessive and frequent menstruation with regular cycle: Secondary | ICD-10-CM | POA: Insufficient documentation

## 2015-09-22 DIAGNOSIS — N939 Abnormal uterine and vaginal bleeding, unspecified: Secondary | ICD-10-CM | POA: Insufficient documentation

## 2015-09-25 ENCOUNTER — Ambulatory Visit (HOSPITAL_COMMUNITY): Payer: Self-pay

## 2015-09-25 ENCOUNTER — Telehealth: Payer: Self-pay | Admitting: General Practice

## 2015-09-25 ENCOUNTER — Telehealth: Payer: Self-pay | Admitting: *Deleted

## 2015-09-25 NOTE — Telephone Encounter (Signed)
Pt contacted the clinic requesting that lab results be sent to a provider she has an appointment with on 9/30.  Contacted patient, patient has an appointment with Dr. Parke Simmers on 9/30 .  Pt to contact that office to see if they chart in EPIC, if not she will come by the office to sign a ROI.  Pt verbalizes understanding.

## 2015-09-25 NOTE — Telephone Encounter (Signed)
Per Dr Ashok Pall, patient's ultrasound was normal and needs follow up with him to discuss management options. Called patient and informed her of ultrasound results and that Dr Ashok Pall would like to see her back in the office for follow up. Patient verbalized understanding to all. Informed patient that someone from the front office will contact her to set up an appt. Patient verbalized understanding and had no questions

## 2015-10-13 ENCOUNTER — Encounter: Payer: Self-pay | Admitting: Obstetrics and Gynecology

## 2015-10-13 ENCOUNTER — Ambulatory Visit (INDEPENDENT_AMBULATORY_CARE_PROVIDER_SITE_OTHER): Payer: Self-pay | Admitting: Obstetrics and Gynecology

## 2015-10-13 VITALS — BP 119/74 | HR 81 | Temp 98.5°F | Wt 163.6 lb

## 2015-10-13 DIAGNOSIS — N924 Excessive bleeding in the premenopausal period: Secondary | ICD-10-CM

## 2015-10-13 DIAGNOSIS — N920 Excessive and frequent menstruation with regular cycle: Secondary | ICD-10-CM | POA: Insufficient documentation

## 2015-10-13 DIAGNOSIS — D509 Iron deficiency anemia, unspecified: Secondary | ICD-10-CM | POA: Insufficient documentation

## 2015-10-13 MED ORDER — FERROUS SULFATE 325 (65 FE) MG PO TABS: 325.0000 mg | ORAL_TABLET | Freq: Every day | ORAL | Status: AC

## 2015-10-13 NOTE — Progress Notes (Signed)
CLINIC ENCOUNTER NOTE  History:  44 y.o. [id number] here today for f/u menorrhage.  Seen here last month, little over a year of progressively longer and heavier periods. CBC shows mild microcytic anemia. EMB wnl. Ultrasound showing possible adenomyosis. Stopped OCPs at our visit given age > 52 and smoking. Also, they were not effective for controlling her bleeding. Is s/p btl  Past Medical History  Diagnosis Date  . Prediabetes   . Anemia     Past Surgical History  Procedure Laterality Date  . Tubal ligation    . Cesarean section      The following portions of the patient's history were reviewed and updated as appropriate: allergies, current medications, past family history, past medical history, past social history, past surgical history and problem list.    Review of Systems:  See above; comprehensive review of systems was otherwise negative.  Objective:  Physical Exam BP 119/74 mmHg  Pulse 81  Temp(Src) 98.5 F (36.9 C)  Wt 163 lb 9.6 oz (74.208 kg)  LMP 09/13/2015 (Exact Date) CONSTITUTIONAL: Well-developed, well-nourished female in no acute distress.  HENT:  Normocephalic, atraumatic SKIN: Skin is warm and dry.  NEUROLGIC: Alert  PSYCHIATRIC: Normal mood and affect.  CARDIOVASCULAR: Normal heart rate noted RESPIRATORY: Effort and breath sounds normal, no problems with respiration noted ABDOMEN: Soft, no distention noted.  No tenderness, rebound or guarding.     Labs and Imaging US Transvaginal Non-ob  09/22/2015  CLINICAL DATA:  Abnormal uterine bleeding. Menorrhagia, dysmenorrhea EXAM: TRANSABDOMINAL AND TRANSVAGINAL ULTRASOUND OF PELVIS TECHNIQUE: Both transabdominal and transvaginal ultrasound examinations of the pelvis were performed. Transabdominal technique was performed for global imaging of the pelvis including uterus, ovaries, adnexal regions, and pelvic cul-de-sac. It was necessary to proceed with endovaginal exam following the transabdominal exam to  visualize the uterus, endometrium, ovaries and adnexa . COMPARISON:  None FINDINGS: Uterus Measurements: 11.0 x 5.8 x 6.4 cm. Diffusely heterogeneous echotexture without focal measurable fibroid Endometrium Thickness: 6 mm in thickness.  No focal abnormality visualized. Right ovary Measurements: 4.1 x 2.3 x 2.6 cm. Small follicle measuring 2.2 cm. No adnexal masses. Left ovary Measurements: 3.4 x 2.1 x 1.5 cm. Normal appearance/no adnexal mass. Other findings No free fluid. IMPRESSION: Diffusely heterogeneous echotexture throughout the myometrium which can be seen with adenomyosis. No focal fibroid. Electronically Signed   By: Charlett Nose M.D.   On: 09/22/2015 11:12   US Pelvis Complete  09/22/2015  CLINICAL DATA:  Abnormal uterine bleeding. Menorrhagia, dysmenorrhea EXAM: TRANSABDOMINAL AND TRANSVAGINAL ULTRASOUND OF PELVIS TECHNIQUE: Both transabdominal and transvaginal ultrasound examinations of the pelvis were performed. Transabdominal technique was performed for global imaging of the pelvis including uterus, ovaries, adnexal regions, and pelvic cul-de-sac. It was necessary to proceed with endovaginal exam following the transabdominal exam to visualize the uterus, endometrium, ovaries and adnexa . COMPARISON:  None FINDINGS: Uterus Measurements: 11.0 x 5.8 x 6.4 cm. Diffusely heterogeneous echotexture without focal measurable fibroid Endometrium Thickness: 6 mm in thickness.  No focal abnormality visualized. Right ovary Measurements: 4.1 x 2.3 x 2.6 cm. Small follicle measuring 2.2 cm. No adnexal masses. Left ovary Measurements: 3.4 x 2.1 x 1.5 cm. Normal appearance/no adnexal mass. Other findings No free fluid. IMPRESSION: Diffusely heterogeneous echotexture throughout the myometrium which can be seen with adenomyosis. No focal fibroid. Electronically Signed   By: Charlett Nose M.D.   On: 09/22/2015 11:12    Assessment & Plan:   # Menorrhagia - w/u revealing for mild microcytic anemia of H 10.4,  possible adenomyosis on u/s. TSH wnl, prolactin wnl, testosterone wnl, pap wnl, EMB wnl - discussed watchful waiting, lng iud, oral progesterone, and possible need for surgical intervention if all else fails - given that patient's only symptom is mild anemia, she has elected to forgo medical management for the time being. Will start ferous sulfate daily and f/u here in 6 months, sooner if bleeding significantly increases, or she wants to try a progesterone option  Routine preventative health maintenance measures emphasized.     Priest Lockridge B. Alarik Radu, MD OB/GYN Fellow [facility], [facility]

## 2015-10-18 ENCOUNTER — Other Ambulatory Visit: Payer: Self-pay

## 2015-12-25 ENCOUNTER — Telehealth: Payer: Self-pay | Admitting: Family Medicine

## 2017-01-28 ENCOUNTER — Encounter: Payer: Self-pay | Admitting: Family Medicine

## 2017-04-11 ENCOUNTER — Ambulatory Visit (INDEPENDENT_AMBULATORY_CARE_PROVIDER_SITE_OTHER): Payer: Self-pay | Admitting: Obstetrics & Gynecology

## 2017-04-11 ENCOUNTER — Encounter: Payer: Self-pay | Admitting: Obstetrics & Gynecology

## 2017-04-11 ENCOUNTER — Encounter (HOSPITAL_COMMUNITY): Payer: Self-pay

## 2017-04-11 VITALS — BP 132/90 | HR 93 | Wt 166.1 lb

## 2017-04-11 DIAGNOSIS — N920 Excessive and frequent menstruation with regular cycle: Secondary | ICD-10-CM

## 2017-04-11 NOTE — Progress Notes (Signed)
   Subjective:    Patient ID: Tonya [name], female    DOB: Jan 15, 1971, 46 y.o.   MRN: [id number]  HPI  46yo separated AA P4 here today to discuss her long standing issue of menorrhagia. She is anemia. Her u/s late 2016 was normal with possibly adenomyosis. She was taken off of her OCPs last year due to her risk of stroke due to her age and smoking. Her period stopped this past Friday.  Review of Systems Has had a BTL. Works at a retirement [employer] in [place] Monogamous for 8 years, denies dyspareunia, occasional use of lubricant She has hot flashes, mood flashes Skipping months of periods at times    Objective:   Physical Exam [id number] Breathing, conversing, and ambulating normally Abd- benign     Assessment & Plan:  Menorrhagia with anemia- offered EMBX and Mirena versus d&c with ablation versus TVH She opts for d&c with ablation She will fill out financial aid forms I will send [place] an email to schedule this

## 2017-05-25 ENCOUNTER — Ambulatory Visit: Admit: 2017-05-25 | Payer: Self-pay | Admitting: Obstetrics & Gynecology

## 2017-05-25 SURGERY — DILATATION & CURETTAGE/HYSTEROSCOPY WITH NOVASURE ABLATION
Anesthesia: Choice

## 2017-06-05 ENCOUNTER — Encounter (HOSPITAL_COMMUNITY): Payer: Self-pay

## 2017-06-05 ENCOUNTER — Emergency Department (HOSPITAL_COMMUNITY)
Admission: EM | Admit: 2017-06-05 | Discharge: 2017-06-05 | Disposition: A | Discharge status: Home / Self Care | Payer: Self-pay | Attending: Emergency Medicine | Admitting: Emergency Medicine

## 2017-06-05 ENCOUNTER — Emergency Department (HOSPITAL_COMMUNITY): Payer: Self-pay

## 2017-06-05 DIAGNOSIS — Y9367 Activity, basketball: Secondary | ICD-10-CM | POA: Insufficient documentation

## 2017-06-05 DIAGNOSIS — M25432 Effusion, left wrist: Secondary | ICD-10-CM

## 2017-06-05 DIAGNOSIS — M25532 Pain in left wrist: Secondary | ICD-10-CM | POA: Insufficient documentation

## 2017-06-05 DIAGNOSIS — Z79899 Other long term (current) drug therapy: Secondary | ICD-10-CM | POA: Insufficient documentation

## 2017-06-05 DIAGNOSIS — Z7984 Long term (current) use of oral hypoglycemic drugs: Secondary | ICD-10-CM | POA: Insufficient documentation

## 2017-06-05 DIAGNOSIS — Y999 Unspecified external cause status: Secondary | ICD-10-CM | POA: Insufficient documentation

## 2017-06-05 DIAGNOSIS — W010XXA Fall on same level from slipping, tripping and stumbling without subsequent striking against object, initial encounter: Secondary | ICD-10-CM | POA: Insufficient documentation

## 2017-06-05 DIAGNOSIS — Y929 Unspecified place or not applicable: Secondary | ICD-10-CM | POA: Insufficient documentation

## 2017-06-05 DIAGNOSIS — F17200 Nicotine dependence, unspecified, uncomplicated: Secondary | ICD-10-CM | POA: Insufficient documentation

## 2017-06-05 NOTE — ED Provider Notes (Addendum)
[facility]-EMERGENCY DEPT Provider Note   CSN: [id number] Arrival date & time: 06/05/17  2044  By signing my name below, I, Tonya [name], attest that this documentation has been prepared under the direction and in the presence of Pricilla LovelessGoldston, Raima Geathers, MD. Electronically Signed: Karren CobbleNy'Kea [name], ED Scribe. 06/05/17. 11:23 PM.  History   Chief Complaint Chief Complaint  Patient presents with  . Cyst   The history is provided by the patient. No language interpreter was used.    HPI HPI Comments: Tonya [name] is a 46 y.o. female with no pertinent PMHx, who presents to the Emergency Department complaining of gradually improving left wrist swelling that began yesterday when she awoke. Pt notes associated limited ROM with the wrist. She reports that she possibly could have slept on wrong but is unsure if this is the case. Yesterday she states she was unable to use her hand yesterday but today she notes gradual improvement. Pt also reports two months ago she was playing basketball with her grandchildren when she slipped and fell on that same wrist and she experienced mild pain at that time, which may be associated. No pain since until yesterday. She has been treating her symptoms with Ibuprofen and periodically icing, and elevation and notes the treatments have moderately reduced the swelling but her movement is still limited. Denies fever, weakness, numbness, tingling, color change, or erythema.   Past Medical History:  Diagnosis Date  . Anemia   . Prediabetes     Patient Active Problem List   Diagnosis Date Noted  . Menorrhagia 10/13/2015  . Microcytic anemia 10/13/2015    Past Surgical History:  Procedure Laterality Date  . CESAREAN SECTION    . TUBAL LIGATION      OB History    Gravida Para Term Preterm AB Living   4 4 4  0 0 4   SAB TAB Ectopic Multiple Live Births   0 0 0 0        Home Medications    Prior to Admission medications   Medication Sig Start Date End Date Taking?  Authorizing Provider  ALPRAZolam Prudy Feeler(XANAX) 0.5 MG tablet Take 0.5 mg by mouth 3 (three) times daily as needed for sleep.    [provider]  ferrous sulfate (FERROUSUL) 325 (65 FE) MG tablet Take 1 tablet (325 mg total) by mouth daily with breakfast. 10/13/15   Wouk, Wilfred CurtisNoah Bedford, MD  metFORMIN (GLUCOPHAGE) 500 MG tablet Take 500 mg by mouth daily.    [provider]  Multiple Vitamins-Minerals (MULTIVITAMIN WITH MINERALS) tablet Take 1 tablet by mouth daily.    [provider]  Norgestimate-Ethinyl Estradiol Triphasic (TRI-SPRINTEC) 0.18/0.215/0.25 MG-35 MCG tablet Take 1 tablet by mouth daily.    [provider]    Family History Family History  Problem Relation Age of Onset  . Rheum arthritis Mother   . Heart failure Mother   . Hypertension Mother   . Diabetes Father   . Cancer Father        prostate  . Hypertension Father    Social History Social History  Substance Use Topics  . Smoking status: Current Every Day Smoker  . Smokeless tobacco: Never Used  . Alcohol use No     Comment: occasional    Allergies   Patient has no known allergies.  Review of Systems Review of Systems  Constitutional: Negative for fever.  Musculoskeletal:       Left wrist pain and edema.   Skin: Negative for color change.  Neurological: Negative for weakness and numbness.  All other systems reviewed and are negative.  Physical Exam Updated Vital Signs BP 132/62 (BP Location: Right Arm)   Pulse 75   Temp 98.6 F (37 C) (Oral)   Resp 19   Ht 5\' 1"  (1.549 m)   Wt 77.3 kg (170 lb 6.4 oz)   SpO2 100%   BMI 32.20 kg/m   Physical Exam  Constitutional: She is oriented to person, place, and time. She appears well-developed and well-nourished.  HENT:  Head: Normocephalic and atraumatic.  Right Ear: External ear normal.  Left Ear: External ear normal.  Nose: Nose normal.  Eyes: Right eye exhibits no discharge. Left eye exhibits no discharge.    Cardiovascular: Normal rate, regular rhythm and normal heart sounds.   2+ left radial pulse   Pulmonary/Chest: Effort normal and breath sounds normal.  Abdominal: Soft. There is no tenderness.  Musculoskeletal:  Mildly limited left wrist ROM due to pain. Normal strength and sensation in the hand. Mild focal swelling to dorsal wrist in between radius/ulna. No skin changes or fluctuance  Neurological: She is alert and oriented to person, place, and time.  Skin: Skin is warm and dry.  Nursing note and vitals reviewed.  ED Treatments / Results  DIAGNOSTIC STUDIES: Oxygen Saturation is 98% on RA, normal by my interpretation.   COORDINATION OF CARE: 11:11 PM-Discussed next steps with pt. Pt verbalized understanding and is agreeable with the plan.   Labs (all labs ordered are listed, but only abnormal results are displayed) Labs Reviewed - No data to display  EKG  EKG Interpretation None       Radiology No results found.  Procedures Procedures (including critical care time)  Medications Ordered in ED Medications - No data to display   Initial Impression / Assessment and Plan / ED Course  I have reviewed the triage vital signs and the nursing notes.  Pertinent labs & imaging results that were available during my care of the patient were reviewed by me and considered in my medical decision making (see chart for details).     Patient presents with dorsal wrist pain and swelling. There is no erythema or fluctuance/induration. No fluid collection seen with bedside ultrasound. No fevers. Neurovascular intact. Her pain and limited range of motion have improved since yesterday. It's possible she slept on it wrong and has had a sprain. No bony tenderness or recent injury (no pain since fall 2 months ago) so I think fracture is highly unlikely. There is no indication of acute infection. Highly doubt septic joint. At this point will continue conservative measures with ice, elevation,  and NSAIDs. Offered her a velcro wrist splint as well for comfort, especially at night. Follow-up with PCP if not improving or worsening. Discussed return precautions.  Final Clinical Impressions(s) / ED Diagnoses   Final diagnoses:  Pain and swelling of left wrist    New Prescriptions New Prescriptions   No medications on file   I personally performed the services described in this documentation, which was scribed in my presence. The recorded information has been reviewed and is accurate.     Pricilla Loveless, MD 06/05/17 6045    Pricilla Loveless, MD 06/05/17 4098    Pricilla Loveless, MD 06/15/17 [phone]

## 2017-06-05 NOTE — ED Notes (Signed)
Pt ambulatory and independent at discharge.  Verbalized understanding of discharge instructions 

## 2017-06-05 NOTE — ED Triage Notes (Signed)
States woke up with knot on left outer wrist no trauma voiced states pain full to move good circulation and sensation noted limited movement.

## 2017-06-05 NOTE — ED Notes (Signed)
Bed: WA01 Expected date:  Expected time:  Means of arrival:  Comments: 

## 2017-06-06 ENCOUNTER — Telehealth (HOSPITAL_COMMUNITY): Payer: Self-pay

## 2017-06-06 NOTE — Telephone Encounter (Signed)
No show for surgery on 05/25/2017

## 2017-08-13 DIAGNOSIS — R7309 Other abnormal glucose: Secondary | ICD-10-CM | POA: Diagnosis not present

## 2017-08-13 DIAGNOSIS — F4329 Adjustment disorder with other symptoms: Secondary | ICD-10-CM | POA: Diagnosis not present

## 2017-08-13 DIAGNOSIS — I10 Essential (primary) hypertension: Secondary | ICD-10-CM | POA: Diagnosis not present

## 2017-08-13 DIAGNOSIS — D649 Anemia, unspecified: Secondary | ICD-10-CM | POA: Diagnosis not present

## 2017-08-15 DIAGNOSIS — R7309 Other abnormal glucose: Secondary | ICD-10-CM | POA: Diagnosis not present

## 2017-08-15 DIAGNOSIS — R5383 Other fatigue: Secondary | ICD-10-CM | POA: Diagnosis not present

## 2017-08-15 DIAGNOSIS — E28319 Asymptomatic premature menopause: Secondary | ICD-10-CM | POA: Diagnosis not present

## 2017-08-17 DIAGNOSIS — H524 Presbyopia: Secondary | ICD-10-CM | POA: Diagnosis not present

## 2017-08-25 DIAGNOSIS — H5210 Myopia, unspecified eye: Secondary | ICD-10-CM | POA: Diagnosis not present

## 2017-09-15 DIAGNOSIS — N951 Menopausal and female climacteric states: Secondary | ICD-10-CM | POA: Diagnosis not present

## 2017-09-15 DIAGNOSIS — E785 Hyperlipidemia, unspecified: Secondary | ICD-10-CM | POA: Diagnosis not present

## 2017-09-15 DIAGNOSIS — R7309 Other abnormal glucose: Secondary | ICD-10-CM | POA: Diagnosis not present

## 2017-09-30 DIAGNOSIS — Z1231 Encounter for screening mammogram for malignant neoplasm of breast: Secondary | ICD-10-CM | POA: Diagnosis not present

## 2017-10-10 DIAGNOSIS — M778 Other enthesopathies, not elsewhere classified: Secondary | ICD-10-CM | POA: Diagnosis not present

## 2017-10-11 DIAGNOSIS — M25531 Pain in right wrist: Secondary | ICD-10-CM | POA: Diagnosis not present

## 2017-10-15 ENCOUNTER — Emergency Department (HOSPITAL_COMMUNITY)
Admission: EM | Admit: 2017-10-15 | Discharge: 2017-10-15 | Disposition: A | Discharge status: Home / Self Care | Payer: BLUE CROSS/BLUE SHIELD | Attending: Emergency Medicine | Admitting: Emergency Medicine

## 2017-10-15 ENCOUNTER — Emergency Department (HOSPITAL_COMMUNITY): Payer: BLUE CROSS/BLUE SHIELD

## 2017-10-15 ENCOUNTER — Encounter (HOSPITAL_COMMUNITY): Payer: Self-pay | Admitting: Emergency Medicine

## 2017-10-15 DIAGNOSIS — M67431 Ganglion, right wrist: Secondary | ICD-10-CM

## 2017-10-15 DIAGNOSIS — X58XXXA Exposure to other specified factors, initial encounter: Secondary | ICD-10-CM | POA: Insufficient documentation

## 2017-10-15 DIAGNOSIS — Y999 Unspecified external cause status: Secondary | ICD-10-CM | POA: Insufficient documentation

## 2017-10-15 DIAGNOSIS — Z79899 Other long term (current) drug therapy: Secondary | ICD-10-CM | POA: Diagnosis not present

## 2017-10-15 DIAGNOSIS — M25531 Pain in right wrist: Secondary | ICD-10-CM | POA: Diagnosis not present

## 2017-10-15 DIAGNOSIS — Y929 Unspecified place or not applicable: Secondary | ICD-10-CM | POA: Insufficient documentation

## 2017-10-15 DIAGNOSIS — S62124A Nondisplaced fracture of lunate [semilunar], right wrist, initial encounter for closed fracture: Secondary | ICD-10-CM | POA: Diagnosis not present

## 2017-10-15 DIAGNOSIS — S62121A Displaced fracture of lunate [semilunar], right wrist, initial encounter for closed fracture: Secondary | ICD-10-CM

## 2017-10-15 DIAGNOSIS — Y939 Activity, unspecified: Secondary | ICD-10-CM | POA: Diagnosis not present

## 2017-10-15 DIAGNOSIS — R7303 Prediabetes: Secondary | ICD-10-CM | POA: Insufficient documentation

## 2017-10-15 DIAGNOSIS — F17210 Nicotine dependence, cigarettes, uncomplicated: Secondary | ICD-10-CM | POA: Insufficient documentation

## 2017-10-15 MED ORDER — TRAMADOL HCL 50 MG PO TABS: 50.0000 mg | ORAL_TABLET | Freq: Four times a day (QID) | ORAL | 0 refills | Status: AC | PRN

## 2017-10-15 NOTE — ED Triage Notes (Signed)
Pt reports having swelling in right wrist for the last 5 days and had blood work done on Tuesday to rule out Gout and has been taking medication for inflammation and no change. Pt now reports two knots on posterior portion or wrist. Worsening pain with movement

## 2017-10-15 NOTE — ED Provider Notes (Signed)
[facility]-EMERGENCY DEPT Provider Note   CSN: [id number] Arrival date & time: 10/15/17  1942     History   Chief Complaint Chief Complaint  Patient presents with  . Wrist Pain    HPI Tonya [name] is a 46 y.o. female who presents to the ED with wrist pain and swelling that started 5 days ago and has gotten progressively worse. She had blood work done 4 days ago to r/o gout and has been taking medication for inflammation without relief. The pain increases with movement. Patient is not aware of any recent injury to the area. She does report an injury to the same are a couple years ago but did not go for treatment at that time.  She also reports a knot on the dorsum of the right wrist.   HPI  Past Medical History:  Diagnosis Date  . Anemia   . Prediabetes     Patient Active Problem List   Diagnosis Date Noted  . Menorrhagia 10/13/2015  . Microcytic anemia 10/13/2015    Past Surgical History:  Procedure Laterality Date  . CESAREAN SECTION    . TUBAL LIGATION      OB History    Gravida Para Term Preterm AB Living   4 4 4  0 0 4   SAB TAB Ectopic Multiple Live Births   0 0 0 0         Home Medications    Prior to Admission medications   Medication Sig Start Date End Date Taking? Authorizing Provider  ALPRAZolam Prudy Feeler) 0.5 MG tablet Take 0.5 mg by mouth 3 (three) times daily as needed for sleep.    [provider]  ferrous sulfate (FERROUSUL) 325 (65 FE) MG tablet Take 1 tablet (325 mg total) by mouth daily with breakfast. 10/13/15   Wouk, Wilfred Curtis, MD  metFORMIN (GLUCOPHAGE) 500 MG tablet Take 500 mg by mouth daily.    [provider]  Multiple Vitamins-Minerals (MULTIVITAMIN WITH MINERALS) tablet Take 1 tablet by mouth daily.    [provider]  Norgestimate-Ethinyl Estradiol Triphasic (TRI-SPRINTEC) 0.18/0.215/0.25 MG-35 MCG tablet Take 1 tablet by mouth daily.    [provider]  traMADol (ULTRAM)  50 MG tablet Take 1 tablet (50 mg total) by mouth every 6 (six) hours as needed. 10/15/17   Janne Napoleon, NP    Family History Family History  Problem Relation Age of Onset  . Rheum arthritis Mother   . Heart failure Mother   . Hypertension Mother   . Diabetes Father   . Cancer Father        prostate  . Hypertension Father     Social History Social History  Substance Use Topics  . Smoking status: Current Every Day Smoker    Packs/day: 0.10    Types: Cigarettes  . Smokeless tobacco: Never Used  . Alcohol use No     Comment: occasional     Allergies   Patient has no known allergies.   Review of Systems Review of Systems  Musculoskeletal: Positive for arthralgias and joint swelling.       Right wrist  Skin: Negative for wound.  All other systems reviewed and are negative.    Physical Exam Updated Vital Signs BP (!) 125/91 (BP Location: Left Arm)   Pulse 88   Temp 97.9 F (36.6 C) (Oral)   Resp 14   Ht 5\' 1"  (1.549 m)   Wt 74.4 kg (164 lb)   LMP 10/10/2017  SpO2 99%   BMI 30.99 kg/m   Physical Exam  Constitutional: She appears well-developed and well-nourished. No distress.  HENT:  Head: Normocephalic and atraumatic.  Eyes: Conjunctivae and EOM are normal.  Neck: Neck supple.  Cardiovascular: Normal rate.   Pulmonary/Chest: Effort normal.  Musculoskeletal:       Right wrist: She exhibits tenderness and swelling. She exhibits no deformity and no laceration. Decreased range of motion: due to pain.  Swelling to the right wrist.Tenderness to the right wrist with palpation and range of motion. Raised tender area to the dorsum of the right wrist with appearance of ganglion cyst.  Neurological: She is alert.  Skin: Skin is warm and dry.  Psychiatric: She has a normal mood and affect. Her behavior is normal.  Nursing note and vitals reviewed.    ED Treatments / Results  Labs (all labs ordered are listed, but only abnormal results are displayed) Labs  Reviewed - No data to display   Radiology Dg Wrist Complete Right  Result Date: 10/15/2017 CLINICAL DATA:  Right wrist pain and swelling since Monday. No relief with medication for inflammation. No recent injury. EXAM: RIGHT WRIST - COMPLETE 3+ VIEW COMPARISON:  None. FINDINGS: Mild degenerative change in the STT joint. Soft tissue calcification over the dorsal aspect of the wrist associated with soft tissue swelling. The dorsal margin of the lunate bone is somewhat truncated suggesting that this may be a displaced fracture fragment arising from the lunate. This could be an acute fracture or chronic fracture with loose body. Alternatively, this could represent a dystrophic or postinflammatory calcification. IMPRESSION: Soft tissue calcification over the dorsal aspect of the right wrist associated with soft tissue swelling and truncation of the dorsal margin of the lunate bone. I believe this represents a nonunited fracture fragment arising from the lunate. Electronically Signed   By: Burman NievesWilliam  Stevens M.D.   On: 10/15/2017 21:08    Procedures Procedures (including critical care time)  Medications Ordered in ED Medications - No data to display   Initial Impression / Assessment and Plan / ED Course  I have reviewed the triage vital signs and the nursing notes. Patient X-Ray with wrist fracture. Pain managed in ED. Pt advised to follow up with orthopedics for further evaluation and treatment.  Patient given splint while in ED, conservative therapy recommended and discussed. Patient stable for d/c without focal neuro deficits and will be dc home & is agreeable with above plan. I have also discussed reasons to return immediately to the ER.  Patient expresses understanding and agrees with plan. Also discussed possible ganglion cyst.  Final Clinical Impressions(s) / ED Diagnoses   Final diagnoses:  Closed displaced fracture of lunate of right wrist, initial encounter  Ganglion cyst of dorsum of  right wrist    New Prescriptions Discharge Medication List as of 10/15/2017  9:43 PM    START taking these medications   Details  traMADol (ULTRAM) 50 MG tablet Take 1 tablet (50 mg total) by mouth every 6 (six) hours as needed., Starting Sat 10/15/2017, Print         [place], [place] Points[place] M, NP 10/16/17 1145    Maia PlanLong, Joshua G, MD 10/16/17 1351

## 2017-10-15 NOTE — ED Notes (Signed)
Pt ambulatory and independent at discharge.  Verbalized understanding of discharge instructions 

## 2017-10-15 NOTE — Discharge Instructions (Signed)
There is a fracture noted on x-ray that could be old. There is an area on the dorsum of your hand that appears as a ganglion cyst. You will need to follow up with the hand surgeon to determine the treatment that needs to be done. Continue the medication for inflammation and take the narcotic. Do not drive while taking the narcotic.

## 2017-10-18 DIAGNOSIS — M25531 Pain in right wrist: Secondary | ICD-10-CM | POA: Diagnosis not present

## 2017-10-19 DIAGNOSIS — Z23 Encounter for immunization: Secondary | ICD-10-CM | POA: Diagnosis not present

## 2017-10-25 DIAGNOSIS — M25531 Pain in right wrist: Secondary | ICD-10-CM | POA: Diagnosis not present

## 2018-01-20 DIAGNOSIS — M25551 Pain in right hip: Secondary | ICD-10-CM | POA: Diagnosis not present

## 2018-02-17 DIAGNOSIS — F4323 Adjustment disorder with mixed anxiety and depressed mood: Secondary | ICD-10-CM | POA: Diagnosis not present

## 2018-02-17 DIAGNOSIS — R7309 Other abnormal glucose: Secondary | ICD-10-CM | POA: Diagnosis not present

## 2018-02-17 DIAGNOSIS — D649 Anemia, unspecified: Secondary | ICD-10-CM | POA: Diagnosis not present

## 2018-02-17 DIAGNOSIS — I10 Essential (primary) hypertension: Secondary | ICD-10-CM | POA: Diagnosis not present

## 2018-02-21 DIAGNOSIS — E785 Hyperlipidemia, unspecified: Secondary | ICD-10-CM | POA: Diagnosis not present

## 2018-02-21 DIAGNOSIS — Z0000 Encounter for general adult medical examination without abnormal findings: Secondary | ICD-10-CM | POA: Diagnosis not present

## 2018-02-21 DIAGNOSIS — D649 Anemia, unspecified: Secondary | ICD-10-CM | POA: Diagnosis not present

## 2018-02-21 DIAGNOSIS — R7309 Other abnormal glucose: Secondary | ICD-10-CM | POA: Diagnosis not present

## 2018-02-21 DIAGNOSIS — F4324 Adjustment disorder with disturbance of conduct: Secondary | ICD-10-CM | POA: Diagnosis not present

## 2018-04-13 DIAGNOSIS — Z713 Dietary counseling and surveillance: Secondary | ICD-10-CM | POA: Diagnosis not present

## 2018-06-22 DIAGNOSIS — T63441A Toxic effect of venom of bees, accidental (unintentional), initial encounter: Secondary | ICD-10-CM | POA: Diagnosis not present

## 2018-06-22 DIAGNOSIS — F320 Major depressive disorder, single episode, mild: Secondary | ICD-10-CM | POA: Diagnosis not present

## 2018-07-06 DIAGNOSIS — Z713 Dietary counseling and surveillance: Secondary | ICD-10-CM | POA: Diagnosis not present

## 2018-07-22 DIAGNOSIS — D649 Anemia, unspecified: Secondary | ICD-10-CM | POA: Diagnosis not present

## 2018-07-22 DIAGNOSIS — R7309 Other abnormal glucose: Secondary | ICD-10-CM | POA: Diagnosis not present

## 2018-07-22 DIAGNOSIS — I10 Essential (primary) hypertension: Secondary | ICD-10-CM | POA: Diagnosis not present

## 2018-07-22 DIAGNOSIS — E785 Hyperlipidemia, unspecified: Secondary | ICD-10-CM | POA: Diagnosis not present

## 2018-07-25 DIAGNOSIS — E785 Hyperlipidemia, unspecified: Secondary | ICD-10-CM | POA: Diagnosis not present

## 2018-07-25 DIAGNOSIS — R7309 Other abnormal glucose: Secondary | ICD-10-CM | POA: Diagnosis not present

## 2018-07-25 DIAGNOSIS — D649 Anemia, unspecified: Secondary | ICD-10-CM | POA: Diagnosis not present

## 2018-10-10 DIAGNOSIS — Z1231 Encounter for screening mammogram for malignant neoplasm of breast: Secondary | ICD-10-CM | POA: Diagnosis not present

## 2018-10-16 DIAGNOSIS — Z23 Encounter for immunization: Secondary | ICD-10-CM | POA: Diagnosis not present

## 2018-10-19 DIAGNOSIS — Z713 Dietary counseling and surveillance: Secondary | ICD-10-CM | POA: Diagnosis not present

## 2019-01-02 DIAGNOSIS — Z713 Dietary counseling and surveillance: Secondary | ICD-10-CM | POA: Diagnosis not present

## 2019-01-20 DIAGNOSIS — H524 Presbyopia: Secondary | ICD-10-CM | POA: Diagnosis not present

## 2019-02-02 DIAGNOSIS — G479 Sleep disorder, unspecified: Secondary | ICD-10-CM | POA: Diagnosis not present

## 2019-02-02 DIAGNOSIS — Z0000 Encounter for general adult medical examination without abnormal findings: Secondary | ICD-10-CM | POA: Diagnosis not present

## 2019-02-02 DIAGNOSIS — K5900 Constipation, unspecified: Secondary | ICD-10-CM | POA: Diagnosis not present

## 2019-02-12 DIAGNOSIS — R42 Dizziness and giddiness: Secondary | ICD-10-CM | POA: Diagnosis not present

## 2019-03-19 ENCOUNTER — Ambulatory Visit: Payer: BLUE CROSS/BLUE SHIELD | Admitting: Obstetrics & Gynecology

## 2019-04-02 DIAGNOSIS — M654 Radial styloid tenosynovitis [de Quervain]: Secondary | ICD-10-CM | POA: Diagnosis not present

## 2019-05-01 DIAGNOSIS — F419 Anxiety disorder, unspecified: Secondary | ICD-10-CM | POA: Diagnosis not present

## 2019-11-17 DIAGNOSIS — Z20828 Contact with and (suspected) exposure to other viral communicable diseases: Secondary | ICD-10-CM | POA: Diagnosis not present

## 2021-11-20 ENCOUNTER — Emergency Department (HOSPITAL_COMMUNITY): Payer: BC Managed Care – PPO

## 2021-11-20 ENCOUNTER — Emergency Department (HOSPITAL_COMMUNITY)
Admission: EM | Admit: 2021-11-20 | Discharge: 2021-11-20 | Disposition: A | Discharge status: Home / Self Care | Payer: BC Managed Care – PPO | Attending: Emergency Medicine | Admitting: Emergency Medicine

## 2021-11-20 ENCOUNTER — Encounter (HOSPITAL_COMMUNITY): Payer: Self-pay | Admitting: Emergency Medicine

## 2021-11-20 DIAGNOSIS — R7303 Prediabetes: Secondary | ICD-10-CM | POA: Insufficient documentation

## 2021-11-20 DIAGNOSIS — Z7984 Long term (current) use of oral hypoglycemic drugs: Secondary | ICD-10-CM | POA: Diagnosis not present

## 2021-11-20 DIAGNOSIS — M25511 Pain in right shoulder: Secondary | ICD-10-CM | POA: Diagnosis not present

## 2021-11-20 DIAGNOSIS — F17210 Nicotine dependence, cigarettes, uncomplicated: Secondary | ICD-10-CM | POA: Insufficient documentation

## 2021-11-20 DIAGNOSIS — M542 Cervicalgia: Secondary | ICD-10-CM | POA: Insufficient documentation

## 2021-11-20 MED ORDER — KETOROLAC TROMETHAMINE 60 MG/2ML IM SOLN
60.0000 mg | Freq: Once | INTRAMUSCULAR | Status: AC
  Administered 2021-11-20: 60 mg via INTRAMUSCULAR
  Filled 2021-11-20: qty 2

## 2021-11-20 MED ORDER — NAPROXEN 500 MG PO TABS: 500.0000 mg | ORAL_TABLET | Freq: Two times a day (BID) | ORAL | 0 refills | Status: AC

## 2021-11-20 MED ORDER — HYDROCODONE-ACETAMINOPHEN 5-325 MG PO TABS: 1.0000 | ORAL_TABLET | Freq: Four times a day (QID) | ORAL | 0 refills | Status: AC | PRN

## 2021-11-20 NOTE — ED Provider Notes (Signed)
[facility] DEPT Provider Note   CSN: [id number] Arrival date & time: 11/20/21  1422     History Chief Complaint  Patient presents with   Extremity Weakness    Tonya [name] is a 50 y.o. female.  She is right-hand dominant.  She is complaining of severe right shoulder pain that started 2 days ago when she woke up.  Is causing her arm to feel weak and is having difficulty using it.  Pain is in through the trapezius and around the shoulder both anterior and posterior lateral.  Not associated with any numbness.  No known trauma.  No prior history of same.  No fevers.  The history is provided by the patient.  Shoulder Pain Location:  Shoulder Shoulder location:  R shoulder Injury: no   Pain details:    Quality:  Aching   Radiates to:  R upper arm   Severity:  Severe   Onset quality:  Gradual   Duration:  2 days   Timing:  Constant   Progression:  Worsening Handedness:  Right-handed Dislocation: no   Prior injury to area:  No Relieved by:  Nothing Worsened by:  Movement Associated symptoms: neck pain   Associated symptoms: no back pain, no fever, no numbness, no swelling and no tingling       Past Medical History:  Diagnosis Date   Anemia    Prediabetes     Patient Active Problem List   Diagnosis Date Noted   Menorrhagia 10/13/2015   Microcytic anemia 10/13/2015    Past Surgical History:  Procedure Laterality Date   CESAREAN SECTION     TUBAL LIGATION       OB History     Gravida  4   Para  4   Term  4   Preterm  0   AB  0   Living  4      SAB  0   IAB  0   Ectopic  0   Multiple  0   Live Births              Family History  Problem Relation Age of Onset   Rheum arthritis Mother    Heart failure Mother    Hypertension Mother    Diabetes Father    Cancer Father        prostate   Hypertension Father     Social History   Tobacco Use   Smoking status: Every Day    Packs/day: 0.10    Types:  Cigarettes   Smokeless tobacco: Never  Substance Use Topics   Alcohol use: No    Comment: occasional   Drug use: No    Home Medications Prior to Admission medications   Medication Sig Start Date End Date Taking? Authorizing Provider  ALPRAZolam Duanne Moron) 0.5 MG tablet Take 0.5 mg by mouth 3 (three) times daily as needed for sleep.    [provider]  ferrous sulfate (FERROUSUL) 325 (65 FE) MG tablet Take 1 tablet (325 mg total) by mouth daily with breakfast. 10/13/15   Wouk, Ailene Rud, MD  metFORMIN (GLUCOPHAGE) 500 MG tablet Take 500 mg by mouth daily.    [provider]  Multiple Vitamins-Minerals (MULTIVITAMIN WITH MINERALS) tablet Take 1 tablet by mouth daily.    [provider]  Norgestimate-Ethinyl Estradiol Triphasic (TRI-SPRINTEC) 0.18/0.215/0.25 MG-35 MCG tablet Take 1 tablet by mouth daily.    [provider]  traMADol (ULTRAM) 50 MG tablet Take 1 tablet (  50 mg total) by mouth every 6 (six) hours as needed. 10/15/17   Ashley Murrain, NP    Allergies    Patient has no known allergies.  Review of Systems   Review of Systems  Constitutional:  Negative for fever.  Musculoskeletal:  Positive for neck pain. Negative for back pain.   Physical Exam Updated Vital Signs BP 100/88 (BP Location: Left Arm)   Pulse (!) 105   Temp 98.3 F (36.8 C) (Oral)   Resp 18   SpO2 100%   Physical Exam Vitals and nursing note reviewed.  Constitutional:      General: She is not in acute distress.    Appearance: Normal appearance. She is well-developed.  HENT:     Head: Normocephalic and atraumatic.  Eyes:     Conjunctiva/sclera: Conjunctivae normal.  Cardiovascular:     Rate and Rhythm: Normal rate and regular rhythm.     Heart sounds: No murmur heard. Pulmonary:     Effort: Pulmonary effort is normal. No respiratory distress.     Breath sounds: Normal breath sounds.  Abdominal:     Palpations: Abdomen is soft.     Tenderness: There is no  abdominal tenderness.  Musculoskeletal:        General: Tenderness present. No swelling or deformity.     Cervical back: Neck supple.     Comments: She has no midline cervical spine tenderness.  She has tenderness throughout her right lateral neck muscles into her trapezius and all throughout her deltoid anterior posterior shoulder.  She has normal internal and external rotation which causes her pain.  She will not attempt any active range of motion.  Full range of motion of elbow and wrist with preserved distal strength sensation and pulses.  Other extremities normal without any pain or weakness.  Skin:    General: Skin is warm and dry.     Capillary Refill: Capillary refill takes less than 2 seconds.  Neurological:     General: No focal deficit present.     Mental Status: She is alert.     Sensory: No sensory deficit.     Motor: No weakness.  Psychiatric:        Mood and Affect: Mood normal.    ED Results / Procedures / Treatments   Labs (all labs ordered are listed, but only abnormal results are displayed) Labs Reviewed - No data to display  EKG None  Radiology DG Shoulder Right  Result Date: 11/20/2021 CLINICAL DATA:  Pain. EXAM: RIGHT SHOULDER - 2+ VIEW COMPARISON:  None. FINDINGS: There are multiple large soft tissue calcifications measuring up to 15 mm surrounding greater tuberosity. There is no acute fracture or dislocation. Joint spaces are well maintained. Soft tissues are otherwise within normal limits. IMPRESSION: 1. No acute bony abnormality. 2. Multiple large soft tissue calcifications measuring up to 15 mm surrounding the greater tuberosity. Findings may represent calcific tendinitis and/or loose bodies. Recommend clinical correlation and follow-up. Electronically Signed   By: Ronney Asters M.D.   On: 11/20/2021 15:49   CT Cervical Spine Wo Contrast  Result Date: 11/20/2021 CLINICAL DATA:  Neck pain EXAM: CT CERVICAL SPINE WITHOUT CONTRAST TECHNIQUE: Multidetector CT  imaging of the cervical spine was performed without intravenous contrast. Multiplanar CT image reconstructions were also generated. COMPARISON:  None. FINDINGS: Alignment: Normal. Skull base and vertebrae: No acute fracture. No primary bone lesion or focal pathologic process. Soft tissues and spinal canal: No prevertebral fluid or swelling. No visible canal hematoma.  Disc levels: Moderate intervertebral disc height loss at C5-C6 with associated endplate sclerosis, cysts and small endplate osteophytes and uncovertebral spurring. Mild to moderate intervertebral disc space narrowing at C6-C7. Upper chest: Mild emphysematous changes in the visualized upper lungs. Other: None. IMPRESSION: 1. No acute osseous abnormality identified. 2. Degenerative changes at C5-C6 and C6-C7. 3. Mild emphysematous changes in the lungs. Electronically Signed   By: Jannifer Hick M.D.   On: 11/20/2021 15:26    Procedures Procedures   Medications Ordered in ED Medications - No data to display  ED Course  I have reviewed the triage vital signs and the nursing notes.  Pertinent labs & imaging results that were available during my care of the patient were reviewed by me and considered in my medical decision making (see chart for details).  Clinical Course as of 11/20/21 1652  Fri Nov 20, 2021  1632 Discussed with orthopedics on-call Dr.Marchwiany.  He said the patient can follow-up next week in the office for evaluation. [MB]    Clinical Course User Index [MB] Terrilee Files, MD   MDM Rules/Calculators/A&P                          50 year old female here with right shoulder pain.  Differential includes fracture, dislocation, tendinitis, myositis, septic joint, rotator cuff tear.  Shoulder does not have any particular warmth or swelling.  Is diffusely tender.  Able to internally externally rotate, doubt septic joint.  X-ray showing some calcifications within the shoulder capsule.  Reviewed with orthopedics.  We will  put on pain medication anti-inflammatories sling for comfort and outpatient orthopedic follow-up.  Patient comfortable plan.  Final Clinical Impression(s) / ED Diagnoses Final diagnoses:  Acute pain of right shoulder    Rx / DC Orders ED Discharge Orders          Ordered    naproxen (NAPROSYN) 500 MG tablet  2 times daily        11/20/21 1652    HYDROcodone-acetaminophen (NORCO/VICODIN) 5-325 MG tablet  Every 6 hours PRN        11/20/21 1652             Terrilee Files, MD 11/21/21 1016

## 2021-11-20 NOTE — Discharge Instructions (Signed)
You were seen in the emergency department for severe right shoulder pain.  Your x-ray did not show any fracture or dislocation but does show some areas of calcification that might represent tendinitis.  Please contact orthopedic office for follow-up next week.  We are prescribing some anti-inflammatory medications and some pain medicine as needed.  Sling for comfort.  Return to the emergency department if any worsening or concerning symptoms.

## 2021-11-20 NOTE — ED Provider Notes (Signed)
Emergency Medicine Provider Triage Evaluation Note  Tonya [name] , [name] 50 y.o. female  was evaluated in triage.  Pt complains of right shoulder pain.  Began 2 days.  Significantly worsening over last 24 hours.  Pain with increased range of motion.  Also has some pain to her posterior right paraspinal neck.  Has been taking ibuprofen without relief.  No recent traumatic injuries, no pop sensations, headache, numbness, weakness.  No history of aneurysms.  No swelling, redness to arm.  No recent procedures to shoulder, neck. Review of Systems  Positive: Right paraspinal neck pain, right shoulder pain Negative: Numbness, weakness, swelling, redness, warmth  Physical Exam  BP 100/88 (BP Location: Left Arm)   Pulse (!) 105   Temp 98.3 F (36.8 C) (Oral)   Resp 18   SpO2 100%  Gen:   Awake, no distress   Neck:  Right paraspinal thoracic tenderness  resp:  Normal effort  MSK:   Significant pain with any range of motion to right shoulder, unable to lift overhead secondary to pain.  Full range of motion wrist, elbow.  Equal grip strength bilaterally, intact Other:    Medical Decision Making  Medically screening exam initiated at 2:47 PM.  Appropriate orders placed.  Tonya [name] was informed that the remainder of the evaluation will be completed by another provider, this initial triage assessment does not replace that evaluation, and the importance of remaining in the ED until their evaluation is complete.  Right cervical paraspinal tenderness, right shoulder pain   Tonya [name] A, PA-C 11/20/21 1449    Tonya Sleeper, MD 11/20/21 1616

## 2021-11-20 NOTE — Progress Notes (Signed)
Orthopedic Tech Progress Note Patient Details:  KIAJA SHORTY 06-04-1971 [id number]  Ortho Devices Type of Ortho Device: Shoulder immobilizer Ortho Device/Splint Location: Right arm Ortho Device/Splint Interventions: Application   Post Interventions Patient Tolerated: Well  Genelle Bal Enio Hornback 11/20/2021, 5:34 PM

## 2021-11-20 NOTE — ED Triage Notes (Signed)
Per patient, states she has not had full ROM of right arm since Wednesday-states she woke up that way-no trauma, no numbness or tingling

## 2021-11-20 NOTE — ED Notes (Signed)
An After Visit Summary was printed and given to the patient. Discharge instructions given and no further questions at this time.  

## 2022-04-23 IMAGING — CT CT CERVICAL SPINE W/O CM
3 of 4 series · 12 of 33 positions shown, 14 images · non-contrast
Comparison: None.

CLINICAL DATA: Neck pain

EXAM:
CT CERVICAL SPINE WITHOUT CONTRAST
TECHNIQUE: Multidetector CT imaging of the cervical spine was performed without
intravenous contrast. Multiplanar CT image reconstructions were also
generated.

[Series 6: orthogonal bone · axial · 0.23mm/px · z∈[-199,-75]mm · 4 of 98 slices shown, 5 images]
[im 14/98  soft-tissue]
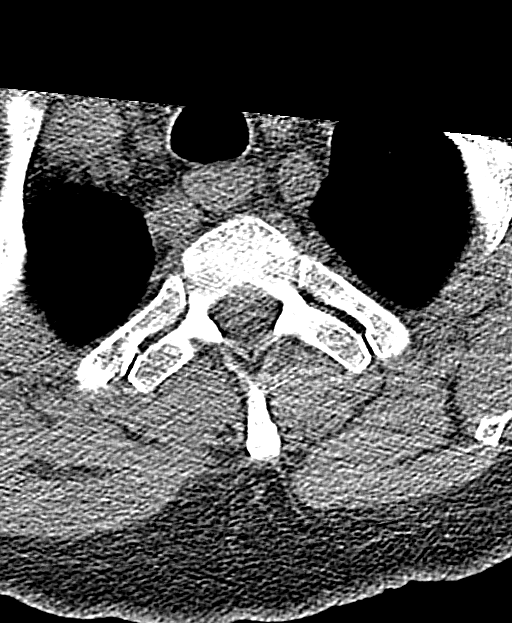
[im 14/98  bone]
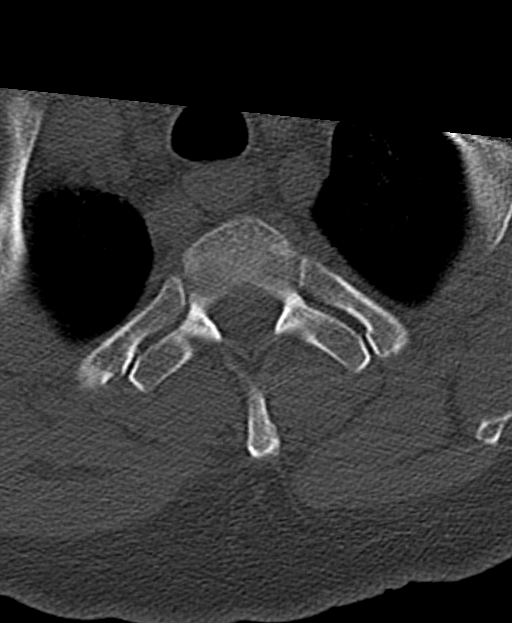
[im 42/98  bone]
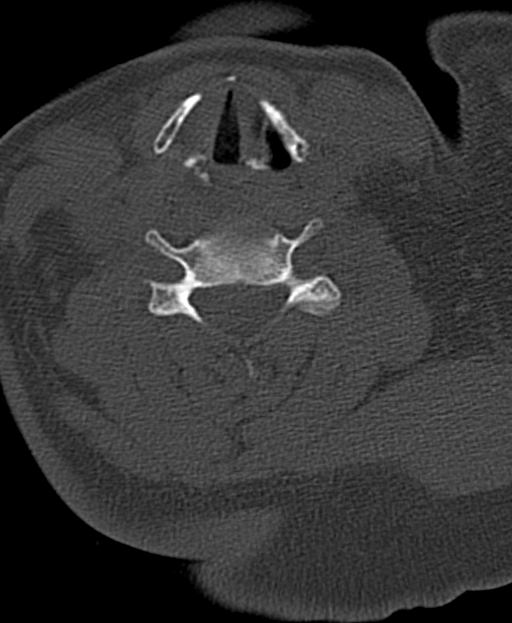
[im 56/98  bone]
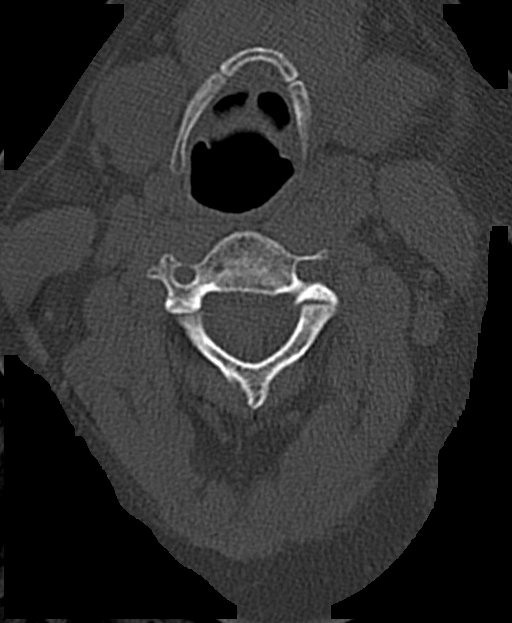
[im 84/98  bone]
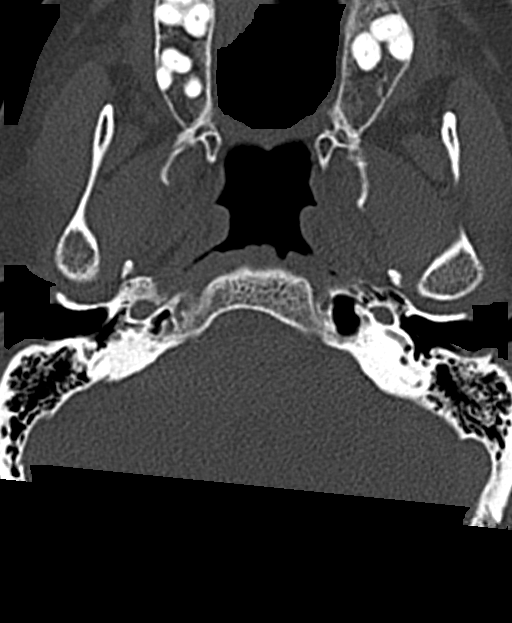

[Series 7: coronal bone · coronal · 0.32mm/px · 3 of 66 slices shown]
[im 14/66  bone]
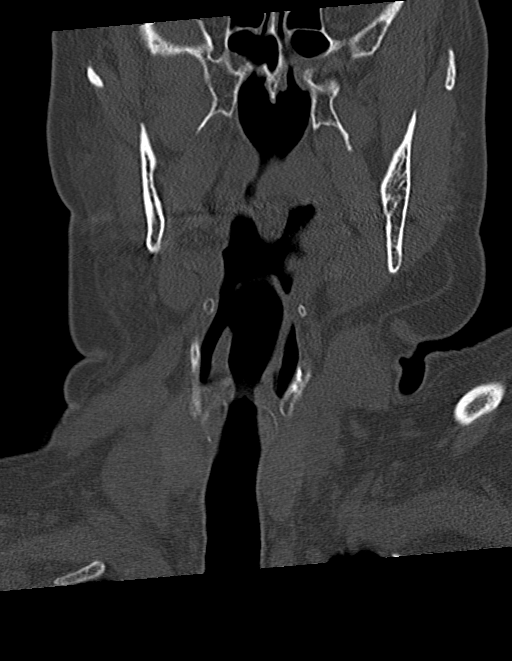
[im 27/66  bone]
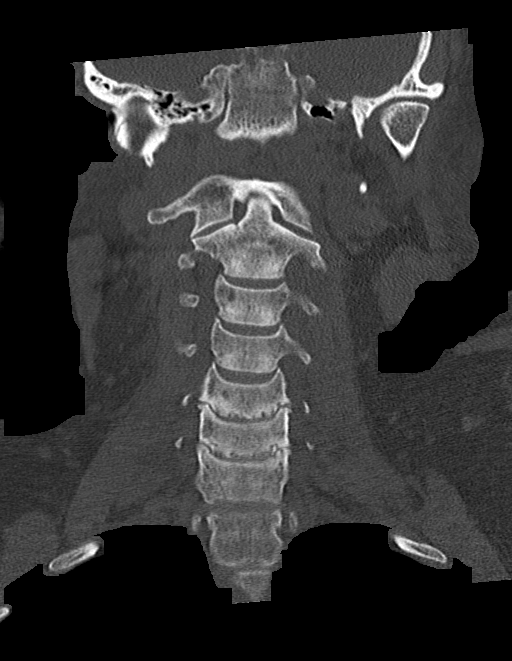
[im 40/66  bone]
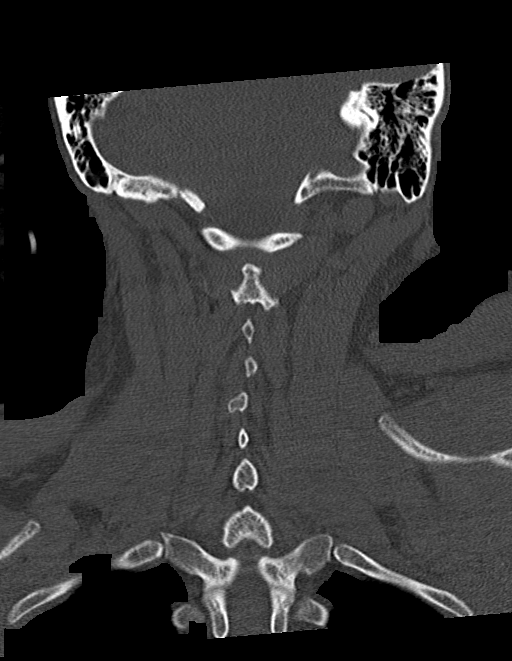

[Series 8: sagittal bone · sagittal · 0.24mm/px · 5 of 61 slices shown, 6 images]
[im 21/61  bone]
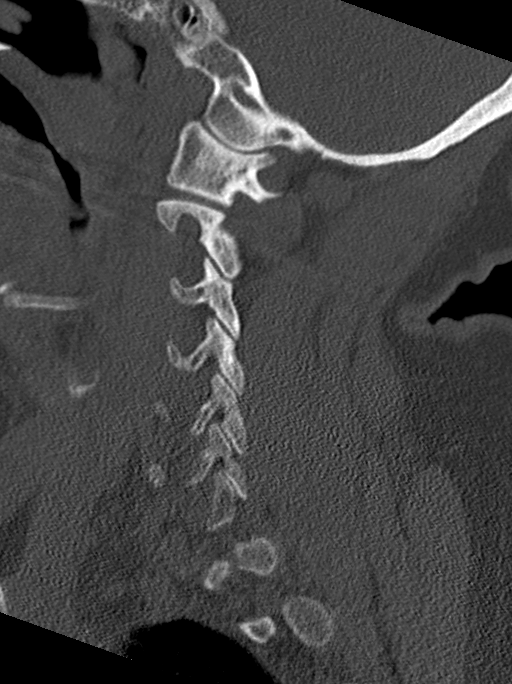
[im 26/61  bone]
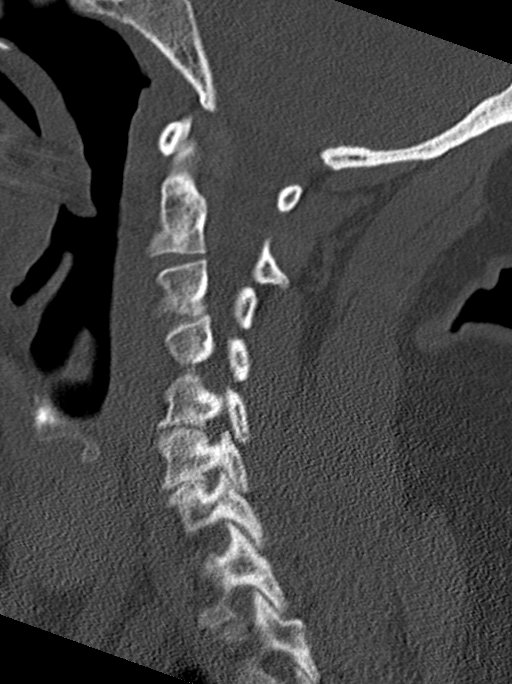
[im 31/61  soft-tissue]
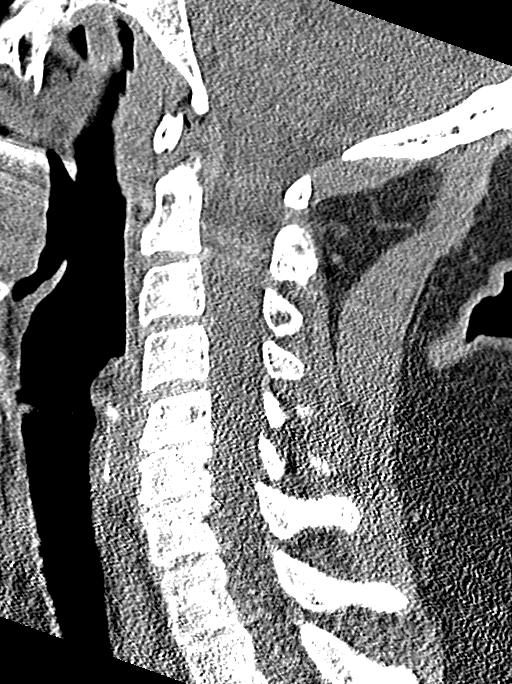
[im 31/61  bone]
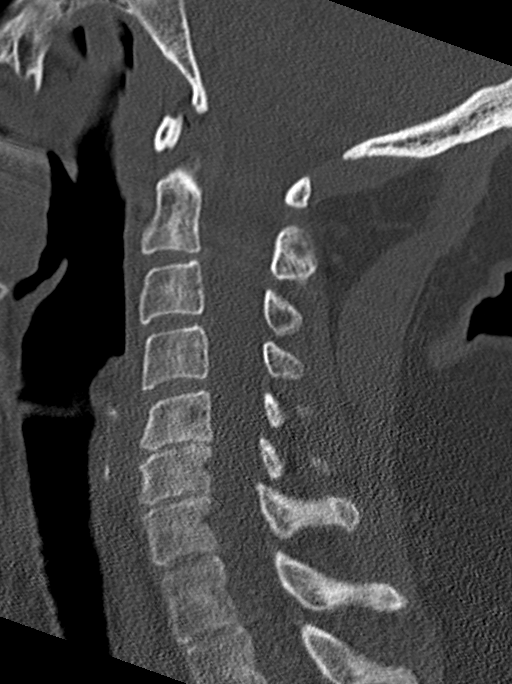
[im 36/61  bone]
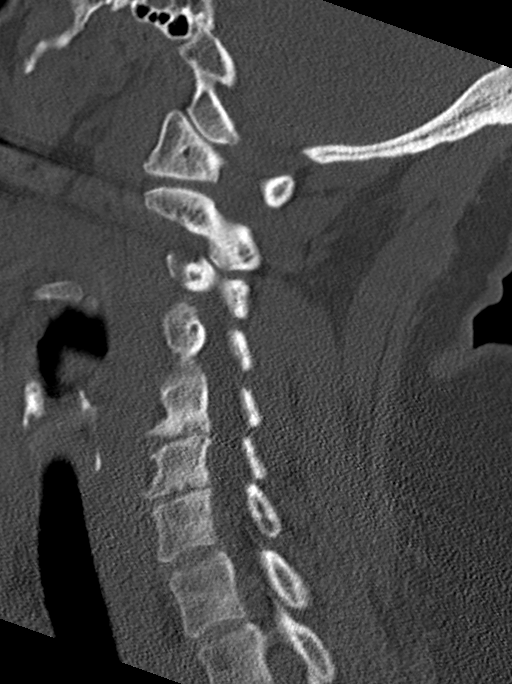
[im 41/61  bone]
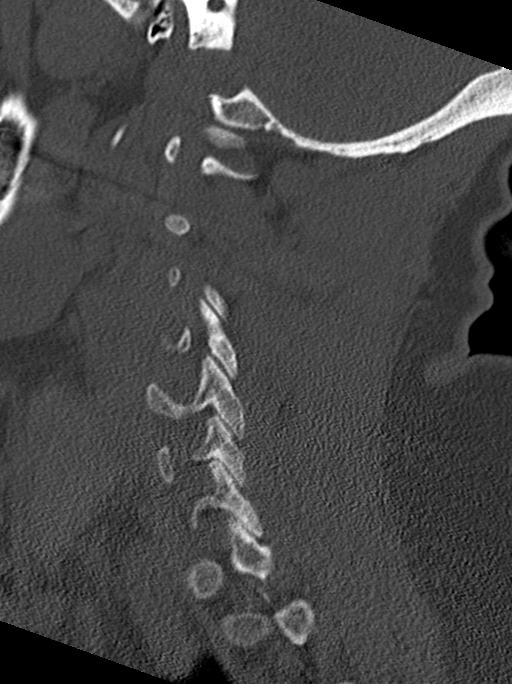

[12 of 33 positions shown; findings below may reference images not displayed]

FINDINGS: Alignment: Normal.

Skull base and vertebrae: No acute fracture. No primary bone lesion
or focal pathologic process.

Soft tissues and spinal canal: No prevertebral fluid or swelling. No
visible canal hematoma.

Disc levels: Moderate intervertebral disc height loss at C5-C6 with
associated endplate sclerosis, cysts and small endplate osteophytes
and uncovertebral spurring. Mild to moderate intervertebral disc
space narrowing at C6-C7.

Upper chest: Mild emphysematous changes in the visualized upper
lungs.

Other: None.
IMPRESSION: 1. No acute osseous abnormality identified.
2. Degenerative changes at C5-C6 and C6-C7.
3. Mild emphysematous changes in the lungs.

## 2022-04-23 IMAGING — CR DG SHOULDER 2+V*R*
2 series · 2 of 2 positions shown · non-contrast
Comparison: None.

CLINICAL DATA: Pain.

EXAM:
RIGHT SHOULDER - 2+ VIEW

[w shoulder external right]
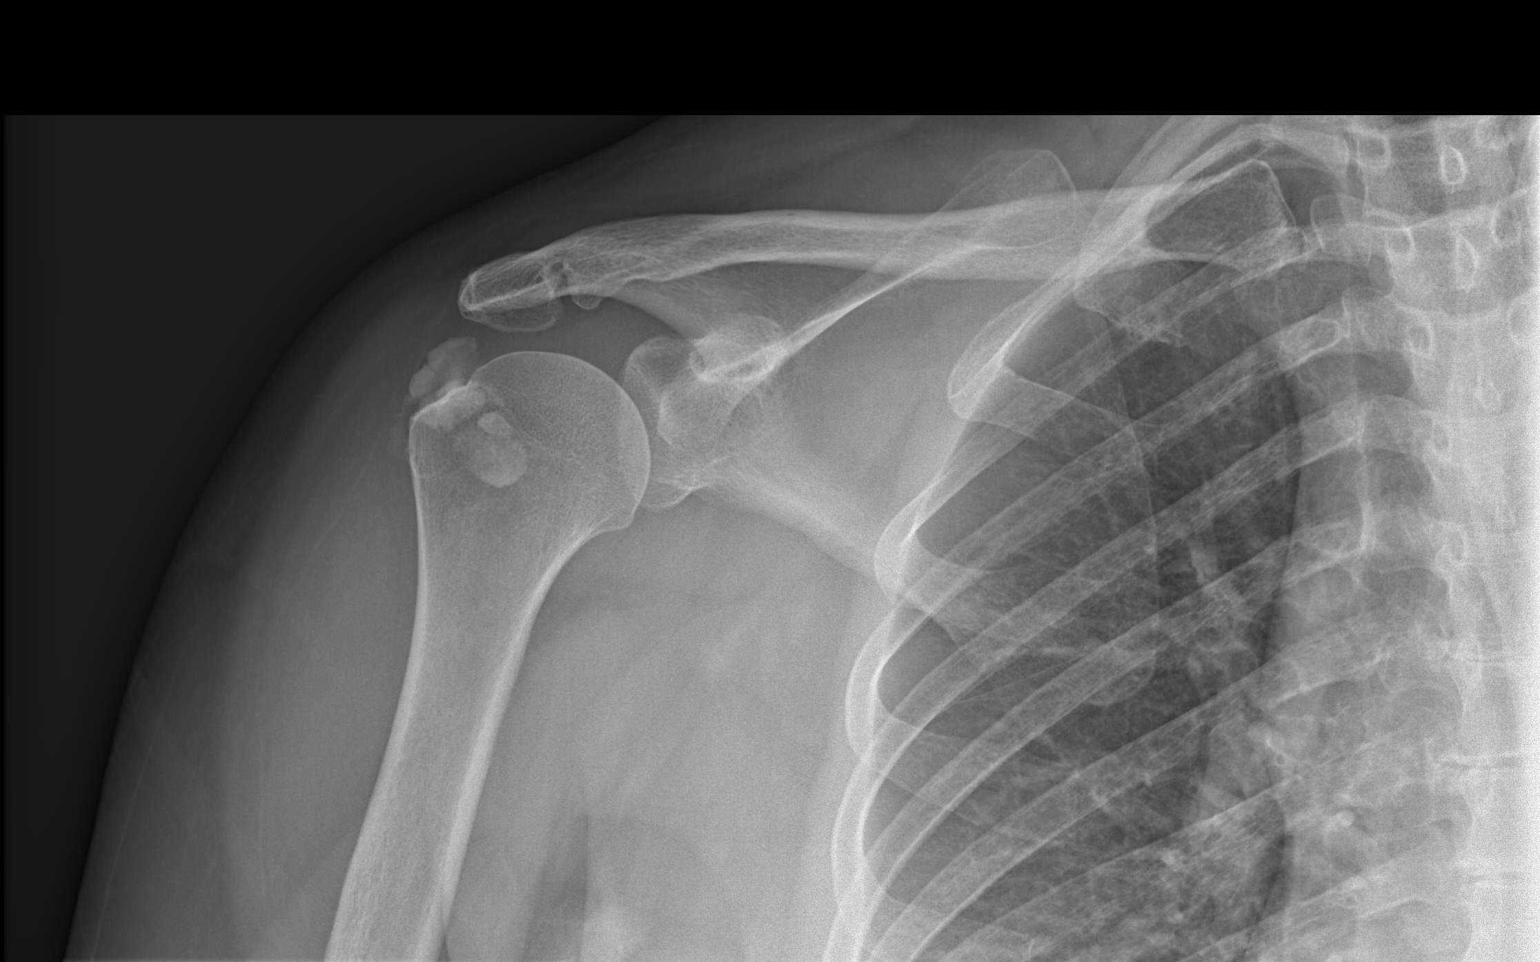

[w shoulder y-view right]
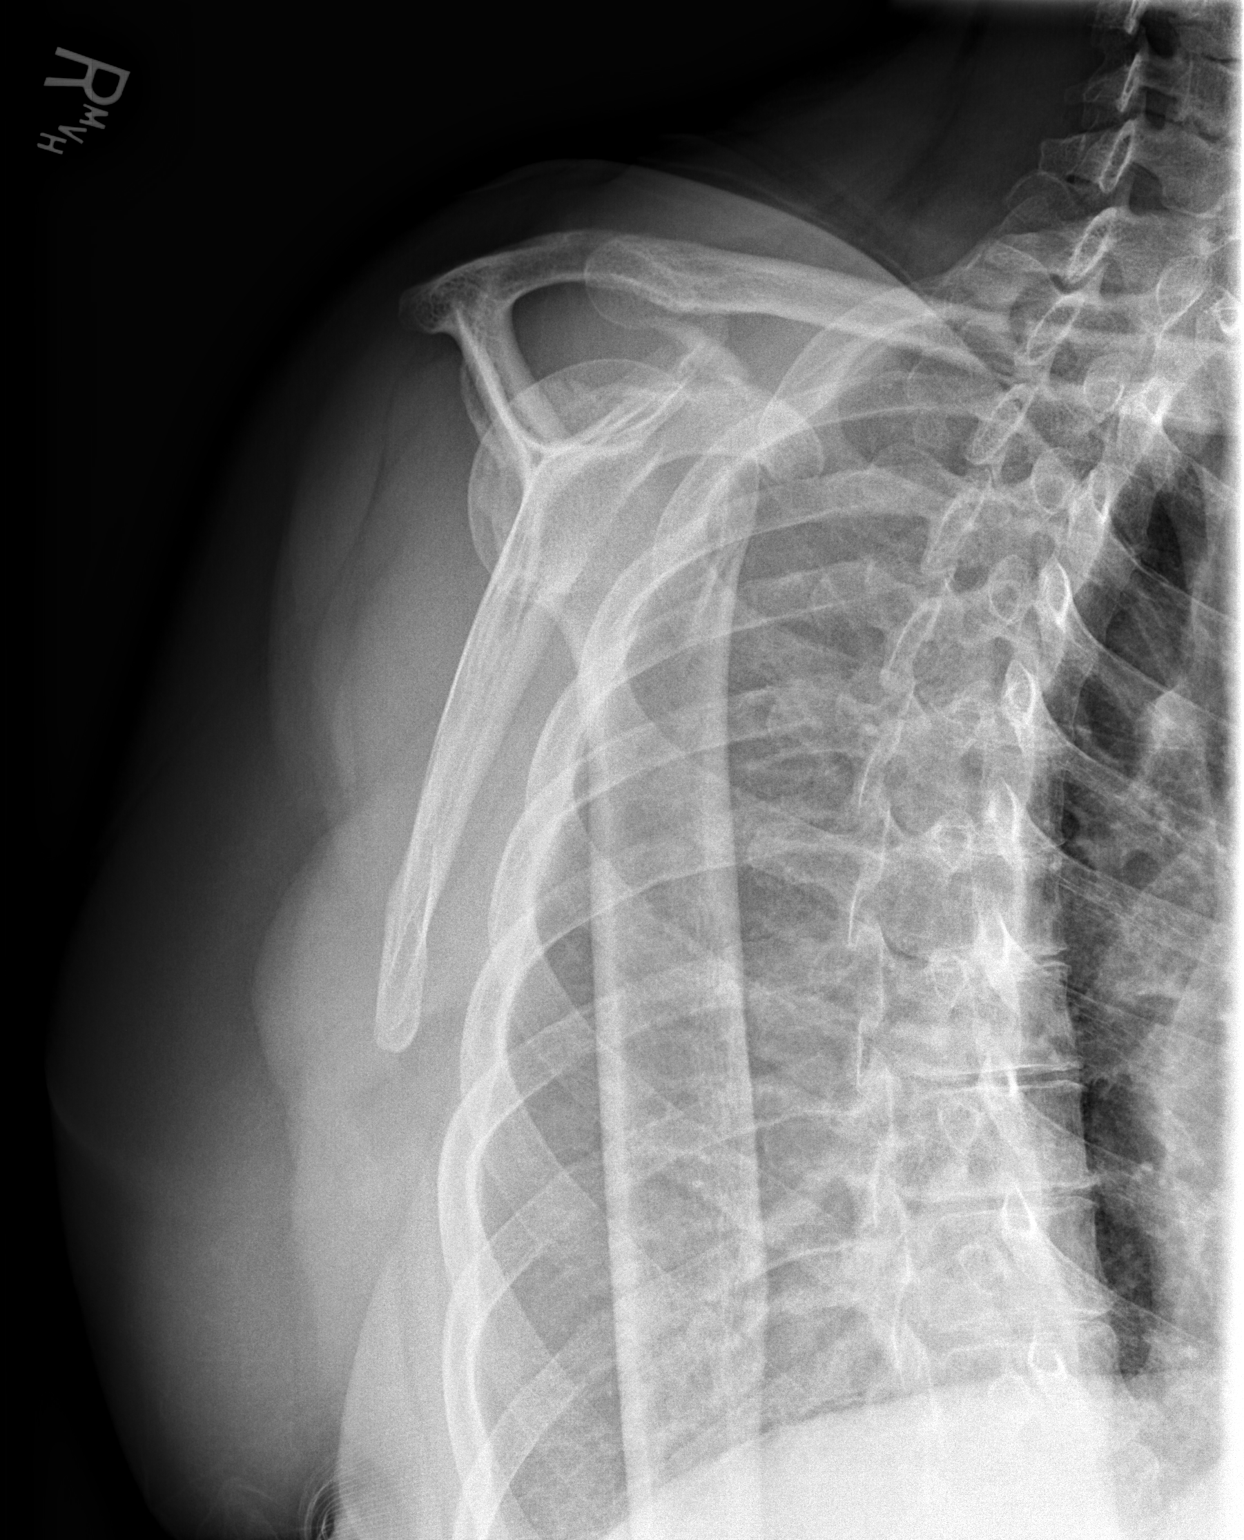

[2 of 2 positions shown; findings below may reference images not displayed]

FINDINGS: There are multiple large soft tissue calcifications measuring up to
15 mm surrounding greater tuberosity. There is no acute fracture or
dislocation. Joint spaces are well maintained. Soft tissues are
otherwise within normal limits.
IMPRESSION: 1. No acute bony abnormality.
2. Multiple large soft tissue calcifications measuring up to 15 mm
surrounding the greater tuberosity. Findings may represent calcific
tendinitis and/or loose bodies. Recommend clinical correlation and
follow-up.

## 2023-04-25 LAB — GLUCOSE, POCT (MANUAL RESULT ENTRY): Glucose Fasting, POC: 100 mg/dL — AB (ref 70–99)

## 2023-08-08 ENCOUNTER — Emergency Department (HOSPITAL_BASED_OUTPATIENT_CLINIC_OR_DEPARTMENT_OTHER): Payer: Managed Care, Other (non HMO)

## 2023-08-08 ENCOUNTER — Encounter (HOSPITAL_BASED_OUTPATIENT_CLINIC_OR_DEPARTMENT_OTHER): Payer: Self-pay

## 2023-08-08 ENCOUNTER — Emergency Department (HOSPITAL_BASED_OUTPATIENT_CLINIC_OR_DEPARTMENT_OTHER)
Admission: EM | Admit: 2023-08-08 | Discharge: 2023-08-08 | Disposition: A | Discharge status: Home / Self Care | Payer: Managed Care, Other (non HMO) | Attending: Emergency Medicine | Admitting: Emergency Medicine

## 2023-08-08 ENCOUNTER — Other Ambulatory Visit: Payer: Self-pay

## 2023-08-08 DIAGNOSIS — M542 Cervicalgia: Secondary | ICD-10-CM | POA: Diagnosis not present

## 2023-08-08 DIAGNOSIS — R519 Headache, unspecified: Secondary | ICD-10-CM | POA: Diagnosis present

## 2023-08-08 DIAGNOSIS — Y92410 Unspecified street and highway as the place of occurrence of the external cause: Secondary | ICD-10-CM | POA: Insufficient documentation

## 2023-08-08 DIAGNOSIS — M545 Low back pain, unspecified: Secondary | ICD-10-CM | POA: Diagnosis not present

## 2023-08-08 DIAGNOSIS — M546 Pain in thoracic spine: Secondary | ICD-10-CM | POA: Insufficient documentation

## 2023-08-08 DIAGNOSIS — Z7984 Long term (current) use of oral hypoglycemic drugs: Secondary | ICD-10-CM | POA: Insufficient documentation

## 2023-08-08 DIAGNOSIS — E119 Type 2 diabetes mellitus without complications: Secondary | ICD-10-CM | POA: Insufficient documentation

## 2023-08-08 MED ORDER — METHOCARBAMOL 500 MG PO TABS: 1000.0000 mg | ORAL_TABLET | Freq: Four times a day (QID) | ORAL | 0 refills | Status: AC
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
## 2023-09-14 ENCOUNTER — Encounter (HOSPITAL_BASED_OUTPATIENT_CLINIC_OR_DEPARTMENT_OTHER): Payer: Self-pay | Admitting: Emergency Medicine

## 2023-09-14 ENCOUNTER — Emergency Department (HOSPITAL_BASED_OUTPATIENT_CLINIC_OR_DEPARTMENT_OTHER)
Admission: EM | Admit: 2023-09-14 | Discharge: 2023-09-14 | Disposition: A | Discharge status: Home / Self Care | Payer: Managed Care, Other (non HMO) | Attending: Emergency Medicine | Admitting: Emergency Medicine

## 2023-09-14 ENCOUNTER — Other Ambulatory Visit: Payer: Self-pay

## 2023-09-14 DIAGNOSIS — Z7984 Long term (current) use of oral hypoglycemic drugs: Secondary | ICD-10-CM | POA: Insufficient documentation

## 2023-09-14 DIAGNOSIS — R42 Dizziness and giddiness: Secondary | ICD-10-CM | POA: Insufficient documentation

## 2023-09-14 DIAGNOSIS — H9312 Tinnitus, left ear: Secondary | ICD-10-CM | POA: Insufficient documentation

## 2023-09-14 MED ORDER — MECLIZINE HCL 25 MG PO TABS: 25.0000 mg | ORAL_TABLET | Freq: Three times a day (TID) | ORAL | 0 refills | Status: AC | PRN

## 2023-09-14 MED ORDER — MECLIZINE HCL 25 MG PO TABS: 25.0000 mg | ORAL_TABLET | Freq: Three times a day (TID) | ORAL | 0 refills | Status: DC | PRN

## 2023-09-14 NOTE — ED Triage Notes (Signed)
Pt to ER with c/o ringing in left ear and dizziness that increases with movement.  PT states dizziness and ear ringing started at same time.
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
## 2023-09-14 NOTE — ED Notes (Signed)
Assumed care of patient. Patient was able to ambulate to the room. Patient is resting in bed.

## 2023-09-14 NOTE — Discharge Instructions (Addendum)
You were seen in the emergency department for dizziness lightheadedness and ear ringing.  We discussed transfer for MRI versus trial of meclizine and you would like to try the meclizine.  Please try it 3 times a day for the first few days and then can start spacing it out.  Drink plenty of fluids.  Rest.  Follow-up with your doctor.  You may also benefit from vestibular therapy which your doctor can refer you to.  Return to the emergency department if any worsening or concerning symptoms
